# Patient Record
Sex: Male | Born: 1970 | Race: Black or African American | Hispanic: No | Marital: Single | Smoking: Never smoker
Health system: Southern US, Community
[De-identification: ages and names within clinical notes are randomized; demographics above are authoritative.]

## PROBLEM LIST (undated history)

## (undated) HISTORY — PX: STERNUM FRACTURE SURGERY: SHX790

---

## 1998-06-08 ENCOUNTER — Emergency Department (HOSPITAL_COMMUNITY): Admission: EM | Admit: 1998-06-08 | Discharge: 1998-06-08 | Payer: Self-pay | Admitting: Emergency Medicine

## 2005-06-11 ENCOUNTER — Emergency Department (HOSPITAL_COMMUNITY): Admission: EM | Admit: 2005-06-11 | Discharge: 2005-06-11 | Payer: Self-pay | Admitting: Family Medicine

## 2012-04-07 ENCOUNTER — Ambulatory Visit (INDEPENDENT_AMBULATORY_CARE_PROVIDER_SITE_OTHER): Payer: BC Managed Care – PPO | Admitting: Family Medicine

## 2012-04-07 VITALS — BP 124/81 | HR 81 | Temp 98.5°F | Resp 17 | Ht 72.5 in | Wt 180.0 lb

## 2012-04-07 DIAGNOSIS — H1032 Unspecified acute conjunctivitis, left eye: Secondary | ICD-10-CM

## 2012-04-07 DIAGNOSIS — H1030 Unspecified acute conjunctivitis, unspecified eye: Secondary | ICD-10-CM

## 2012-04-07 MED ORDER — OFLOXACIN 0.3 % OP SOLN: 2.0000 [drp] | Freq: Four times a day (QID) | OPHTHALMIC | Status: DC

## 2012-04-07 NOTE — Progress Notes (Signed)
  [facility]:  Office Visit  Chief Complaint:  Chief Complaint  Patient presents with  . Eye Injury    HPI: Richard Walters is a 42 y.o. male who complains of left eye redness. Started early this AM. Was at home with this and then went to work. 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 He did work in the yard this weekend. He has not rubbed his eyes. Denies any  vision problems, light sensitivity, eye pain.. Denies HA, fevers, + itchy. No prior eye injuries.    History reviewed. No pertinent past medical history. History reviewed. No pertinent past surgical history. History   Social History  . Marital Status: Single    Spouse Name: N/A    Number of Children: N/A  . Years of Education: N/A   Social History Main Topics  . Smoking status: Never Smoker   . Smokeless tobacco: None  . Alcohol Use: No  . Drug Use: No  . Sexually Active: Yes    Birth Control/ Protection: None   Other Topics Concern  . None   Social History Narrative  . None   Family History  Problem Relation Age of Onset  . Hypertension Father    No Known Allergies Prior to Admission medications   Not on File     ROS: The patient denies fevers, chills, night sweats, unintentional weight loss, chest pain, palpitations, wheezing, dyspnea on exertion, nausea, vomiting, abdominal pain, dysuria, hematuria, melena, numbness, weakness, or tingling.   All other systems have been reviewed and were otherwise negative with the exception of those mentioned in the HPI and as above.    PHYSICAL EXAM: Filed Vitals:   04/07/12 0928  BP: 124/81  Pulse: 81  Temp: 98.5 F (36.9 C)  Resp: 17   Filed Vitals:   04/07/12 0928  Height: 6' 0.5" (1.842 m)  Weight: 180 lb (81.647 kg)   Body mass index is 24.08 kg/(m^2).  General: Alert, no acute distress HEENT:  Normocephalic, atraumatic, oropharynx patent. EOMI, PERRLA, fundoscopic  exam nl. + injected sclera/subconjunctiva erythema on lateral aspect with yellow mucus dc. No corneal lesions on fluroscein exam Cardiovascular:  Regular rate and rhythm, no rubs murmurs or gallops.  No Carotid bruits, radial pulse intact. No pedal edema.  Respiratory: Clear to auscultation bilaterally.  No wheezes, rales, or rhonchi.  No cyanosis, no use of accessory musculature GI: No organomegaly, abdomen is soft and non-tender, positive bowel sounds.  No masses. Skin: No rashes. Neurologic: Facial musculature symmetric. Psychiatric: Patient is appropriate throughout our interaction. Lymphatic: No cervical lymphadenopathy Musculoskeletal: Gait intact.   LABS: No results found for this or any previous visit.   EKG/XRAY:   Primary read interpreted by Dr. Conley Rolls at [facility].   ASSESSMENT/PLAN: Encounter Diagnosis  Name Primary?  . Acute conjunctivitis, left eye Yes   Early Bacterial conjunctivitis vs allergic conjunctivitis Rx Ocuflox x 5 days, Cold compresses Work note x 2 days off. Rest eye.  Monitor for worsening s/sx ie swelling, vision changes, more drainage, eye pain. F/u prn    Saddie Sandeen PHUONG, DO 04/07/2012 10:39 AM

## 2012-04-08 ENCOUNTER — Telehealth: Payer: Self-pay | Admitting: Family Medicine

## 2012-04-08 NOTE — Telephone Encounter (Signed)
Patient's eye is getting better slowly.

## 2012-04-10 ENCOUNTER — Other Ambulatory Visit: Payer: Self-pay | Admitting: Family Medicine

## 2012-04-10 DIAGNOSIS — H109 Unspecified conjunctivitis: Secondary | ICD-10-CM

## 2012-04-10 MED ORDER — PREDNISOLONE ACETATE 1 % OP SUSP: 2.0000 [drp] | Freq: Four times a day (QID) | OPHTHALMIC | Status: DC

## 2012-04-11 ENCOUNTER — Ambulatory Visit (INDEPENDENT_AMBULATORY_CARE_PROVIDER_SITE_OTHER): Payer: BC Managed Care – PPO | Admitting: Family Medicine

## 2012-04-11 VITALS — BP 111/72 | HR 64 | Temp 98.1°F | Resp 18 | Ht 72.5 in | Wt 180.0 lb

## 2012-04-11 DIAGNOSIS — H1032 Unspecified acute conjunctivitis, left eye: Secondary | ICD-10-CM

## 2012-04-11 DIAGNOSIS — H109 Unspecified conjunctivitis: Secondary | ICD-10-CM

## 2012-04-11 MED ORDER — PREDNISOLONE ACETATE 1 % OP SUSP: 2.0000 [drp] | Freq: Four times a day (QID) | OPHTHALMIC | Status: DC

## 2012-04-11 MED ORDER — OFLOXACIN 0.3 % OP SOLN: 2.0000 [drp] | Freq: Four times a day (QID) | OPHTHALMIC | Status: DC

## 2012-04-11 NOTE — Progress Notes (Signed)
[facility]:  Office Visit  Chief Complaint:  Chief Complaint  Patient presents with  . Follow-up    eye    HPI: Richard Walters is a 42 y.o. male who complains of recheck left eye conjunctivitis, he now has it in right eye. Left eye is getting better but now right eye has similar redness and drainage.  When his right eye started having similar sxs he used ocuflox on both eyes. Denies any pain, vision loss or photophobia. Itches and he wasn't to scratch but denies scratching. He has some mild lid swelling, wakes up with crusted eyes. On 1/20 when he came in he had been working out in yard, he does work around [employer] but is not in direct contact with them. Wears protective eye gear. Denies any rashes/burning/tingling.   History reviewed. No pertinent past medical history. History reviewed. No pertinent past surgical history. History   Social History  . Marital Status: Single    Spouse Name: N/A    Number of Children: N/A  . Years of Education: N/A   Social History Main Topics  . Smoking status: Never Smoker   . Smokeless tobacco: None  . Alcohol Use: No  . Drug Use: No  . Sexually Active: Yes    Birth Control/ Protection: None   Other Topics Concern  . None   Social History Narrative  . None   Family History  Problem Relation Age of Onset  . Hypertension Father    No Known Allergies Prior to Admission medications   Medication Sig Start Date End Date Taking? Authorizing Provider  ofloxacin (OCUFLOX) 0.3 % ophthalmic solution Place 2 drops into the left eye 4 (four) times daily. 04/07/12  Yes Tyshika Baldridge P Kylon Philbrook, DO  prednisoLONE acetate (PRED FORTE) 1 % ophthalmic suspension Place 2 drops into the left eye 4 (four) times daily. For 3 days only 04/10/12  Yes Merissa Renwick P Tanise Russman, DO     ROS: The patient denies fevers, chills, night sweats, unintentional weight loss, chest pain, palpitations, wheezing, dyspnea on exertion, nausea, vomiting, abdominal pain, dysuria,  hematuria, melena, numbness, weakness, or tingling.   All other systems have been reviewed and were otherwise negative with the exception of those mentioned in the HPI and as above.    PHYSICAL EXAM: Filed Vitals:   04/11/12 1600  BP: 111/72  Pulse: 64  Temp: 98.1 F (36.7 C)  Resp: 18   Filed Vitals:   04/11/12 1600  Height: 6' 0.5" (1.842 m)  Weight: 180 lb (81.647 kg)   Body mass index is 24.08 kg/(m^2).  General: Alert, no acute distress HEENT:  Normocephalic, atraumatic, oropharynx patent. EOMI, PERRLA, fundoscpic exam nl. Sclera slightly erythematous bilaterally, No photophobia. + yellow and white mucus  Cardiovascular:  Regular rate and rhythm, no rubs murmurs or gallops.  No Carotid bruits, radial pulse intact. No pedal edema.  Respiratory: Clear to auscultation bilaterally.  No wheezes, rales, or rhonchi.  No cyanosis, no use of accessory musculature GI: No organomegaly, abdomen is soft and non-tender, positive bowel sounds.  No masses. Skin: No rashes. Neurologic: Facial musculature symmetric. Psychiatric: Patient is appropriate throughout our interaction. Lymphatic: No cervical lymphadenopathy Musculoskeletal: Gait intact.   LABS: No results found for this or any previous visit.   EKG/XRAY:   Primary read interpreted by Dr. Conley Rolls at [facility].   ASSESSMENT/PLAN: Encounter Diagnoses  Name Primary?  . Conjunctivitis of both eyes Yes  . Conjunctivitis   . Acute conjunctivitis, left eye  Patient doing about the same but now conjunctivitis in both eyes.  ? allergic vs less likely  bacterial or combination of both Fluoroscein exam had no uptake.  F/u prn, Reorder meds.  Ocuflox and prednisolone opthalmic. Cold compresses    Kacper Cartlidge PHUONG, DO 04/11/2012 4:32 PM

## 2014-07-13 ENCOUNTER — Telehealth: Payer: Self-pay | Admitting: Family Medicine

## 2014-07-13 NOTE — Telephone Encounter (Signed)
lmom to reschedule his appt Dr Conley RollsLe will be out of the office on 07/30/14

## 2014-07-16 ENCOUNTER — Ambulatory Visit (INDEPENDENT_AMBULATORY_CARE_PROVIDER_SITE_OTHER): Payer: BLUE CROSS/BLUE SHIELD | Admitting: Family Medicine

## 2014-07-16 ENCOUNTER — Encounter: Payer: Self-pay | Admitting: Family Medicine

## 2014-07-16 VITALS — BP 100/66 | HR 67 | Temp 97.9°F | Resp 16 | Ht 73.0 in | Wt 177.2 lb

## 2014-07-16 DIAGNOSIS — G44209 Tension-type headache, unspecified, not intractable: Secondary | ICD-10-CM

## 2014-07-16 DIAGNOSIS — Z23 Encounter for immunization: Secondary | ICD-10-CM

## 2014-07-16 DIAGNOSIS — Z0000 Encounter for general adult medical examination without abnormal findings: Secondary | ICD-10-CM

## 2014-07-16 DIAGNOSIS — Z113 Encounter for screening for infections with a predominantly sexual mode of transmission: Secondary | ICD-10-CM

## 2014-07-16 LAB — COMPREHENSIVE METABOLIC PANEL
ALT: 19 U/L (ref 0–53)
AST: 20 U/L (ref 0–37)
Albumin: 4.1 g/dL (ref 3.5–5.2)
Alkaline Phosphatase: 47 U/L (ref 39–117)
BUN: 12 mg/dL (ref 6–23)
CALCIUM: 9 mg/dL (ref 8.4–10.5)
CHLORIDE: 103 meq/L (ref 96–112)
CO2: 26 mEq/L (ref 19–32)
CREATININE: 1.01 mg/dL (ref 0.50–1.35)
GLUCOSE: 82 mg/dL (ref 70–99)
POTASSIUM: 4.2 meq/L (ref 3.5–5.3)
SODIUM: 139 meq/L (ref 135–145)
TOTAL PROTEIN: 6.4 g/dL (ref 6.0–8.3)
Total Bilirubin: 0.5 mg/dL (ref 0.2–1.2)

## 2014-07-16 LAB — POCT URINALYSIS DIPSTICK
BILIRUBIN UA: NEGATIVE
GLUCOSE UA: NEGATIVE
KETONES UA: NEGATIVE
Leukocytes, UA: NEGATIVE
Nitrite, UA: NEGATIVE
PH UA: 6.5
Protein, UA: NEGATIVE
RBC UA: NEGATIVE
SPEC GRAV UA: 1.01
Urobilinogen, UA: 1

## 2014-07-16 LAB — LIPID PANEL
Cholesterol: 152 mg/dL (ref 0–200)
HDL: 47 mg/dL (ref >=40)
LDL CALC: 86 mg/dL (ref 0–99)
TRIGLYCERIDES: 93 mg/dL (ref <150)
Total CHOL/HDL Ratio: 3.2 Ratio
VLDL: 19 mg/dL (ref 0–40)

## 2014-07-16 LAB — TSH: TSH: 2.201 u[IU]/mL (ref 0.350–4.500)

## 2014-07-16 NOTE — Progress Notes (Signed)
Subjective:  This chart was scribed for Norberto Sorenson, MD by [facility], medical scribe at [facility].The patient was seen in exam room 24 and the patient's care was started at 2:46 PM.   Patient ID: Richard Walters, male    DOB: 1970/04/25, 44 y.o.   MRN: [id number] Chief Complaint  Patient presents with   Annual Exam   HPI HPI Comments: Richard Walters is a 43 y.o. male who presents to [facility] for an annual physical exam. Pt had blood drawn this morning.  Family history of heart disease and high cholesterol, diabetes on the paternal side. Pt does not smoke, he has roughly four drinks a week.Unsure of last tetanus shot. He takes vitamin supplements. 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 Pt has intermittent back pain, headache and fatigue due to work. No history of depression.  Occasionally cannot sleep, he does not wake up refreshed after sleeping 6 hours but does after 8 hours. He has been told he snores.  Pt complains of rhinorrhea, and increased bowel movement after eating or drinking diary products.   No past medical history on file. Current Outpatient Prescriptions on File Prior to Visit  Medication Sig Dispense Refill   ofloxacin (OCUFLOX) 0.3 % ophthalmic solution Place 2 drops into both eyes 4 (four) times daily. (Patient not taking: Reported on 07/16/2014) 5 mL 0   prednisoLONE acetate (PRED FORTE) 1 % ophthalmic suspension Place 2 drops into both eyes 4 (four) times daily. For 3 days only (Patient not taking: Reported on 07/16/2014) 5 mL 0   No current facility-administered medications on file prior to visit.   No Known Allergies History   Social History   Marital Status: Single    Spouse Name: N/A   Number of Children: N/A   Years of Education: N/A   Occupational History   Location manager    Social History Main Topics   Smoking status: Never Smoker     Smokeless tobacco: Not on file   Alcohol Use: 2.4 oz/week    4 Standard drinks or equivalent per week     Comment: BEER and WINE   Drug Use: No   Sexual Activity: Yes    Pharmacist, hospital Protection: None   Other Topics Concern   Not on file   Social History Narrative   Exercise running, weights, and cardio   Review of Systems  Constitutional: Positive for fatigue.  HENT: Positive for rhinorrhea.   Gastrointestinal: Positive for diarrhea.  Musculoskeletal: Positive for back pain.  Neurological: Positive for headaches.  Psychiatric/Behavioral: Positive for sleep disturbance.      Objective:  BP 100/66 mmHg   Pulse 67   Temp(Src) 97.9 F (36.6 C) (Oral)   Resp 16   Ht  (1.854 m)   Wt 177 lb 3.2 oz (80.377 kg)   BMI 23.38 kg/m2   SpO2 97% Physical Exam  Constitutional: He is oriented to person, place, and time. He appears well-developed and well-nourished. No distress.  HENT:  Head: Normocephalic and atraumatic.  Minimal post nasal drip.  Eyes: Pupils are equal, round, and reactive to light.  Neck: Normal range of motion.  Cardiovascular: Normal rate, regular rhythm, S1 normal, S2 normal and normal heart sounds.   No murmur heard. Pulmonary/Chest: Effort normal. No respiratory distress.  Musculoskeletal: Normal range of motion.  Neurological: He is alert and oriented  to person, place, and time.  Skin: Skin is warm and dry.  Psychiatric: He has a normal mood and affect. His behavior is normal.  Nursing note and vitals reviewed.     Assessment & Plan:   1. Health maintenance examination   2. Screening for STD (sexually transmitted disease)   3. Tension headache   4. Need for diphtheria-tetanus-pertussis (Tdap) vaccine, adult/adolescent     Orders Placed This Encounter  Procedures   GC/Chlamydia Probe Amp   Trichomonas vaginalis, RNA   Tdap vaccine greater than or equal to 7yo IM   CBC   Lipid panel    Order Specific Question:  Has the patient  fasted?    Answer:  Yes   TSH   RPR   Comprehensive metabolic panel    Order Specific Question:  Has the patient fasted?    Answer:  Yes   HIV antibody   Hepatitis C antibody   POCT urinalysis dipstick   EKG 12-Lead     I personally performed the services described in this documentation, which was scribed in my presence. The recorded information has been reviewed and considered, and addended by me as needed.  Norberto SorensonEva Shaw, MD MPH   Results for orders placed or performed in visit on 07/16/14  CBC  Result Value Ref Range   WBC 3.8 (L) 4.0 - 10.5 K/uL   RBC 4.96 4.22 - 5.81 MIL/uL   Hemoglobin 14.9 13.0 - 17.0 g/dL   HCT 16.145.1 09.639.0 - 04.552.0 %   MCV 90.9 78.0 - 100.0 fL   MCH 30.0 26.0 - 34.0 pg   MCHC 33.0 30.0 - 36.0 g/dL   RDW 40.914.5 81.111.5 - 91.415.5 %   Platelets 202 150 - 400 K/uL   MPV 11.0 8.6 - 12.4 fL  Lipid panel  Result Value Ref Range   Cholesterol 152 0 - 200 mg/dL   Triglycerides 93 <782<150 mg/dL   HDL 47 >=95>=40 mg/dL   Total CHOL/HDL Ratio 3.2 Ratio   VLDL 19 0 - 40 mg/dL   LDL Cholesterol 86 0 - 99 mg/dL  TSH  Result Value Ref Range   TSH 2.201 0.350 - 4.500 uIU/mL  RPR  Result Value Ref Range   RPR Ser Ql NON REAC NON REAC  Comprehensive metabolic panel  Result Value Ref Range   Sodium 139 135 - 145 mEq/L   Potassium 4.2 3.5 - 5.3 mEq/L   Chloride 103 96 - 112 mEq/L   CO2 26 19 - 32 mEq/L   Glucose, Bld 82 70 - 99 mg/dL   BUN 12 6 - 23 mg/dL   Creat 6.211.01 3.080.50 - 6.571.35 mg/dL   Total Bilirubin 0.5 0.2 - 1.2 mg/dL   Alkaline Phosphatase 47 39 - 117 U/L   AST 20 0 - 37 U/L   ALT 19 0 - 53 U/L   Total Protein 6.4 6.0 - 8.3 g/dL   Albumin 4.1 3.5 - 5.2 g/dL   Calcium 9.0 8.4 - 84.610.5 mg/dL  HIV antibody  Result Value Ref Range   HIV 1&2 Ab, 4th Generation NONREACTIVE NONREACTIVE  Hepatitis C antibody  Result Value Ref Range   HCV Ab NEGATIVE NEGATIVE  POCT urinalysis dipstick  Result Value Ref Range   Color, UA Light Yellow    Clarity, UA Clear     Glucose, UA Negative    Bilirubin, UA Negative    Ketones, UA Negative    Spec Grav, UA 1.010    Blood, UA Negative  pH, UA 6.5    Protein, UA Negative    Urobilinogen, UA 1.0    Nitrite, UA Negative    Leukocytes, UA Negative

## 2014-07-17 LAB — CBC
HEMATOCRIT: 45.1 % (ref 39.0–52.0)
Hemoglobin: 14.9 g/dL (ref 13.0–17.0)
MCH: 30 pg (ref 26.0–34.0)
MCHC: 33 g/dL (ref 30.0–36.0)
MCV: 90.9 fL (ref 78.0–100.0)
MPV: 11 fL (ref 8.6–12.4)
PLATELETS: 202 10*3/uL (ref 150–400)
RBC: 4.96 MIL/uL (ref 4.22–5.81)
RDW: 14.5 % (ref 11.5–15.5)
WBC: 3.8 10*3/uL — ABNORMAL LOW (ref 4.0–10.5)

## 2014-07-17 LAB — HEPATITIS C ANTIBODY: HCV AB: NEGATIVE

## 2014-07-17 LAB — HIV ANTIBODY (ROUTINE TESTING W REFLEX): HIV: NONREACTIVE

## 2014-07-17 LAB — RPR

## 2014-07-20 LAB — GC/CHLAMYDIA PROBE AMP
CT Probe RNA: NEGATIVE
GC Probe RNA: NEGATIVE

## 2014-07-20 LAB — TRICHOMONAS VAGINALIS, PROBE AMP: TRICHOMONAS VAGINALIS PROBE APTIMA: NEGATIVE

## 2014-07-21 ENCOUNTER — Encounter: Payer: Self-pay | Admitting: Physician Assistant

## 2014-07-23 ENCOUNTER — Telehealth: Payer: Self-pay

## 2014-07-23 NOTE — Telephone Encounter (Signed)
Dr. Clelia CroftShaw, I called patient to find out what specifically the patient was requesting FMLA for. He states that he is wanting a reduced work schedule due to tension headaches that were discussed during his OV. I filled the paperwork out based on the OV notes, but was not sure if this was discussed in enough detail to warrant FMLA. I will place paperwork in your box for your review. Please let me know if I can do anything else. Thank you

## 2014-07-23 NOTE — Telephone Encounter (Signed)
Pt dropped off FMLA ppw for completion by Dr. Clelia CroftShaw in 5-7 business days. Will place in Dr. Alver FisherShaw's box for review/signature. Please return to disability box, located at [address] checkout, upon completion. Blank copies have been scanned into epic under release [id number]. Once received Jasmine or myself will Scan Completed paperwork into release, fax to [phone], and notify patient.

## 2014-07-26 NOTE — Telephone Encounter (Signed)
Jasmine, release was closed on 5/8, but no paperwork scanned into release, did you receive this? Placing back on hold until paperwork has been scanned into release.

## 2014-07-30 ENCOUNTER — Encounter: Payer: Self-pay | Admitting: Family Medicine

## 2014-07-30 DIAGNOSIS — Z0271 Encounter for disability determination: Secondary | ICD-10-CM

## 2014-09-26 NOTE — Telephone Encounter (Signed)
Found pt's paperwork and completed it - returned to disability slot - no charge please if pt has not paid already due to length of time it took me to complete. sorry

## 2015-01-23 ENCOUNTER — Encounter: Payer: Self-pay | Admitting: Family Medicine

## 2015-05-11 ENCOUNTER — Ambulatory Visit (INDEPENDENT_AMBULATORY_CARE_PROVIDER_SITE_OTHER): Payer: PRIVATE HEALTH INSURANCE | Admitting: Family Medicine

## 2015-05-11 VITALS — BP 130/80 | HR 78 | Temp 98.3°F | Resp 20 | Ht 73.5 in | Wt 176.0 lb

## 2015-05-11 DIAGNOSIS — Z202 Contact with and (suspected) exposure to infections with a predominantly sexual mode of transmission: Secondary | ICD-10-CM | POA: Diagnosis not present

## 2015-05-11 MED ORDER — AZITHROMYCIN 500 MG PO TABS: 1000.0000 mg | ORAL_TABLET | Freq: Once | ORAL | Status: DC

## 2015-05-11 NOTE — Progress Notes (Signed)
This a 45 year old gentleman who works for [employer]. He presents after his girlfriend found out that she has chlamydia and she's broken off her relationship with him today.  Patient had no symptoms of discharge or burning. He's had no skin lesions on his external genitalia.  He's been with his girlfriend for some time and was hoping to buy a house with her until this recent development.  Objective:BP 130/80 mmHg  Pulse 78  Temp(Src) 98.3 F (36.8 C) (Oral)  Resp 20  Ht 6' 1.5" (1.867 m)  Wt 176 lb (79.833 kg)  BMI 22.90 kg/m2  SpO2 99% HEENT: Unremarkable Genitalia: Normal male with no skin changes, circumcised  This chart was  reviewed by me personally.    ICD-9-CM ICD-10-CM   1. STD exposure V01.6 Z20.2 GC/Chlamydia Probe Amp     RPR     HIV antibody     azithromycin (ZITHROMAX) 500 MG tablet     Signed, Elvina Sidle, MD

## 2015-05-12 LAB — GC/CHLAMYDIA PROBE AMP
CT Probe RNA: DETECTED — AB
GC Probe RNA: NOT DETECTED

## 2015-05-12 LAB — RPR

## 2015-05-12 LAB — HIV ANTIBODY (ROUTINE TESTING W REFLEX): HIV 1&2 Ab, 4th Generation: NONREACTIVE

## 2015-11-01 ENCOUNTER — Ambulatory Visit (INDEPENDENT_AMBULATORY_CARE_PROVIDER_SITE_OTHER): Payer: BLUE CROSS/BLUE SHIELD | Admitting: Physician Assistant

## 2015-11-01 VITALS — BP 122/84 | HR 87 | Temp 97.0°F | Resp 16 | Ht 73.0 in | Wt 176.9 lb

## 2015-11-01 DIAGNOSIS — R300 Dysuria: Secondary | ICD-10-CM | POA: Diagnosis not present

## 2015-11-01 NOTE — Progress Notes (Signed)
   Nicola PoliceGlen L Sultan  MRN: [id number] DOB: Jun 29, 1970  Subjective:  Pt presents to clinic with urethral tingling that started yesterday.  He had sex with his ex without barrier protection 5 days ago.  He has had no urethral discharge.  The sensation does not worsen with urination but it is there all the time.  He thought it might be he needed to increase fluids but he did that yesterday but the symptoms did not improve.  He is having no scrotum or testicular pain.  Review of Systems  Constitutional: Negative for chills and fever.  Gastrointestinal: Negative for abdominal pain.  Genitourinary: Positive for dysuria and penile pain (tingling). Negative for decreased urine volume, discharge, flank pain, frequency, penile swelling, scrotal swelling, testicular pain and urgency.    There are no active problems to display for this patient.   No current outpatient prescriptions on file prior to visit.   No current facility-administered medications on file prior to visit.     Not on File  Pt patients past, family and social history were reviewed and updated.  Objective:  BP 122/84 (BP Location: Left Arm, Patient Position: Sitting, Cuff Size: Normal)   Pulse 87   Temp 97 F (36.1 C) (Oral)   Resp 16   Ht 6\' 1"  (1.854 m)   Wt 176 lb 14.4 oz (80.2 kg)   SpO2 98%   BMI 23.34 kg/m   Physical Exam  Constitutional: He is oriented to person, place, and time and well-developed, well-nourished, and in no distress.  HENT:  Head: Normocephalic and atraumatic.  Right Ear: External ear normal.  Left Ear: External ear normal.  Eyes: Conjunctivae are normal.  Neck: Normal range of motion.  Pulmonary/Chest: Effort normal.  Genitourinary: Testes/scrotum normal. He exhibits no testicular tenderness and no scrotal tenderness. Clear and discharge found.  Neurological: He is alert and oriented to person, place, and time. Gait normal.  Skin: Skin is warm and dry.  Psychiatric: Mood, memory, affect  and judgment normal.    Assessment and Plan :  Dysuria - Plan: GC/Chlamydia Probe Amp, Trichomonas vaginalis, RNA  Check lab - treat if indicated -- d/w pt other STD testing that he might want to consider having and he will consider that in the future but today he would like to just do the above.  Benny LennertSarah Arlita Buffkin PA-C  [facility] 11/01/2015 7:26 PM

## 2015-11-01 NOTE — Patient Instructions (Signed)
     IF you received an x-ray today, you will receive an invoice from [facility]. Please contact [facility] at [phone] with questions or concerns regarding your invoice.   IF you received labwork today, you will receive an invoice from [facility]. Please contact [facility] at [phone] with questions or concerns regarding your invoice.   Our billing staff will not be able to assist you with questions regarding bills from these companies.  You will be contacted with the lab results as soon as they are available. The fastest way to get your results is to activate your My Chart account. Instructions are located on the last page of this paperwork. If you have not heard from us regarding the results in 2 weeks, please contact this office.      

## 2015-11-02 LAB — GC/CHLAMYDIA PROBE AMP
CT Probe RNA: NOT DETECTED
GC Probe RNA: NOT DETECTED

## 2015-11-02 LAB — TRICHOMONAS VAGINALIS, PROBE AMP: TRICHOMONAS VAGINALIS PROBE APTIMA: NOT DETECTED

## 2016-11-26 ENCOUNTER — Encounter: Payer: Self-pay | Admitting: Physician Assistant

## 2016-11-26 ENCOUNTER — Ambulatory Visit (INDEPENDENT_AMBULATORY_CARE_PROVIDER_SITE_OTHER): Payer: BLUE CROSS/BLUE SHIELD | Admitting: Physician Assistant

## 2016-11-26 VITALS — BP 124/86 | HR 66 | Temp 98.2°F | Resp 16 | Ht 73.0 in | Wt 175.8 lb

## 2016-11-26 DIAGNOSIS — Z113 Encounter for screening for infections with a predominantly sexual mode of transmission: Secondary | ICD-10-CM

## 2016-11-26 DIAGNOSIS — Z0000 Encounter for general adult medical examination without abnormal findings: Secondary | ICD-10-CM

## 2016-11-26 DIAGNOSIS — Z125 Encounter for screening for malignant neoplasm of prostate: Secondary | ICD-10-CM | POA: Diagnosis not present

## 2016-11-26 DIAGNOSIS — Z114 Encounter for screening for human immunodeficiency virus [HIV]: Secondary | ICD-10-CM | POA: Diagnosis not present

## 2016-11-26 DIAGNOSIS — Z130 Encounter for screening for diseases of the blood and blood-forming organs and certain disorders involving the immune mechanism: Secondary | ICD-10-CM

## 2016-11-26 DIAGNOSIS — Z1329 Encounter for screening for other suspected endocrine disorder: Secondary | ICD-10-CM | POA: Diagnosis not present

## 2016-11-26 DIAGNOSIS — Z13220 Encounter for screening for lipoid disorders: Secondary | ICD-10-CM | POA: Diagnosis not present

## 2016-11-26 DIAGNOSIS — Z13228 Encounter for screening for other metabolic disorders: Secondary | ICD-10-CM

## 2016-11-26 NOTE — Patient Instructions (Addendum)
IF you received an x-ray today, you will receive an invoice from [facility]. Please contact [facility] at [phone] with questions or concerns regarding your invoice.   IF you received labwork today, you will receive an invoice from [facility]. Please contact [facility] at [phone] with questions or concerns regarding your invoice.   Our billing staff will not be able to assist you with questions regarding bills from these companies.  You will be contacted with the lab results as soon as they are available. The fastest way to get your results is to activate your My Chart account. Instructions are located on the last page of this paperwork. If you have not heard from [place] regarding the results in 2 weeks, please contact this office.    Keeping you healthy  Get these tests  Blood pressure- Have your blood pressure checked once a year by your healthcare provider.  Normal blood pressure is 120/80.  Weight- Have your body mass index (BMI) calculated to screen for obesity.  BMI is a measure of body fat based on height and weight. You can also calculate your own BMI at [employer]velBags.it.  Cholesterol- Have your cholesterol checked regularly starting at age 21, sooner may be necessary if you have diabetes, high blood pressure, if a family member developed heart diseases at an early age or if you smoke.   Chlamydia, HIV, and other sexual transmitted disease- Get screened each year until the age of 65 then within three months of each new sexual partner.  Diabetes- Have your blood sugar checked regularly if you have high blood pressure, high cholesterol, a family history of diabetes or if you are overweight.  Get these vaccines  Flu shot- Every fall.  Tetanus shot- Every 10 years.  Menactra- Single dose; prevents meningitis.  Take these steps  Don't smoke- If you do smoke, ask your healthcare provider about quitting. For tips on how to quit, go to  www.smokefree.gov or call 1-800-QUIT-NOW.  Be physically active- Exercise 5 days a week for at least 30 minutes.  If you are not already physically active start slow and gradually work up to 30 minutes of moderate physical activity.  Examples of moderate activity include walking briskly, mowing the yard, dancing, swimming bicycling, etc.  Eat a healthy diet- Eat a variety of healthy foods such as fruits, vegetables, low fat milk, low fat cheese, yogurt, lean meats, poultry, fish, beans, tofu, etc.  For more information on healthy eating, go to www.thenutritionsource.org  Drink alcohol in moderation- Limit alcohol intake two drinks or less a day.  Never drink and drive.  Dentist- Brush and floss teeth twice daily; visit your dentis twice a year.  Depression-Your emotional health is as important as your physical health.  If you're feeling down, losing interest in things you normally enjoy please talk with your healthcare provider.  Gun Safety- If you keep a gun in your home, keep it unloaded and with the safety lock on.  Bullets should be stored separately.  Helmet use- Always wear a helmet when riding a motorcycle, bicycle, rollerblading or skateboarding.  Safe sex- If you may be exposed to a sexually transmitted infection, use a condom  Seat belts- Seat bels can save your life; always wear one.  Smoke/Carbon Monoxide detectors- These detectors need to be installed on the appropriate level of your home.  Replace batteries at least once a year.  Skin Cancer- When out in the sun, cover up and use sunscreen SPF 15 or higher.  Violence- If anyone is threatening or hurting you, please tell your healthcare provider.   Safe Sex Practicing safe sex means taking steps before and during sex to reduce your risk of:  Getting an STD (sexually transmitted disease).  Giving your partner an STD.  Unwanted pregnancy.  How can I practice safe sex?  To practice safe sex:  Limit your sexual  partners to only one partner who is having sex with only you.  Avoid using alcohol and recreational drugs before having sex. These substances can affect your judgment.  Before having sex with a new partner: ? Talk to your partner about past partners, past STDs, and drug use. ? You and your partner should be screened for STDs and discuss the results with each other.  Check your body regularly for sores, blisters, rashes, or unusual discharge. If you notice any of these problems, visit your health care provider.  If you have symptoms of an infection or you are being treated for an STD, avoid sexual contact.  While having sex, use a condom. Make sure to: ? Use a condom every time you have vaginal, oral, or anal sex. Both females and males should wear condoms during oral sex. ? Keep condoms in place from the beginning to the end of sexual activity. ? Use a latex condom, if possible. Latex condoms offer the best protection. ? Use only water-based lubricants or oils to lubricate a condom. Using petroleum-based lubricants or oils will weaken the condom and increase the chance that it will break.  See your health care provider for regular screenings, exams, and tests for STDs.  Talk with your health care provider about the form of birth control (contraception) that is best for you.  Get vaccinated against hepatitis B and human papillomavirus (HPV).  If you are at risk of being infected with HIV (human immunodeficiency virus), talk with your health care provider about taking a prescription medicine to prevent HIV infection. You are considered at risk for HIV if: ? You are a man who has sex with other men. ? You are a heterosexual man or woman who is sexually active with more than one partner. ? You take drugs by injection. ? You are sexually active with a partner who has HIV.  This information is not intended to replace advice given to you by your health care provider. Make sure you discuss any  questions you have with your health care provider. Document Released: 04/12/2004 Document Revised: 07/20/2015 Document Reviewed: 01/23/2015 Elsevier Interactive Patient Education  [employer]2018 Elsevier Inc.

## 2016-11-26 NOTE — Progress Notes (Signed)
PRIMARY CARE AT [facility] [address] [place] [address] [phone]  Date:  11/26/2016   Name:  AHMIR BRACKEN   DOB:  06-04-1970   MRN:  [id number]  PCP:  Shawnee Knapp, MD    History of Present Illness:  ROBBI SCURLOCK is a 46 y.o. male patient who presents to PCP with  Chief Complaint  Patient presents with  . Annual Exam     DIET: he cooks every now and then.  Cooks fast food occasionally.  Vegetables daily with salad, collard greens.  Water intake: 64-84 oz.  Sodas: 2 per day non caffeinated.  No caffeine    BM: normal.  No blood or black stool.  No constipation.  BM are regular after meals.   URINATION: normal.  Dysuria, hematuria, or frequency  SLEEP: 6 hours.  No difficulty getting or staying asleep  SOCIAL ACTIVITY: motor cycle, detail cars, outdoor activities.  Wears helmet in [place] --exercise: play tennis, walk  EtOH: 3-4 beers per week Illicit drug use: none Tobacco or vaping: none  SEXUAL activity: multiple partners.  Unprotected.   Last opthalmology: 2018, slight change.   There are no active problems to display for this patient.   History reviewed. No pertinent past medical history.  History reviewed. No pertinent surgical history.  Social History  Substance Use Topics  . Smoking status: Never Smoker  . Smokeless tobacco: Never Used  . Alcohol use 2.4 oz/week    4 Standard drinks or equivalent per week     Comment: BEER and WINE    Family History  Problem Relation Age of Onset  . Hypertension Father   . Anemia Sister   . Diabetes Paternal Grandmother   . Hyperlipidemia Paternal Grandmother     No Known Allergies  Medication list has been reviewed and updated.  No current outpatient prescriptions on file prior to visit.   No current facility-administered medications on file prior to visit.     Review of Systems  Constitutional: Negative for chills and fever.  HENT: Negative for ear discharge, ear pain and sore throat.   Eyes:  Negative for blurred vision and double vision.  Respiratory: Negative for cough, shortness of breath and wheezing.   Cardiovascular: Negative for chest pain, palpitations and leg swelling.  Gastrointestinal: Negative for diarrhea, nausea and vomiting.  Genitourinary: Negative for dysuria, frequency and hematuria.  Skin: Negative for itching and rash.  Neurological: Negative for dizziness and headaches.   ROS otherwise unremarkable unless listed above.  Physical Examination: BP 124/86   Pulse 66   Temp 98.2 F (36.8 C) (Oral)   Resp 16   Ht _0  (1.854 m)   Wt 175 lb 12.8 oz (79.7 kg)   SpO2 97%   BMI 23.19 kg/m  Ideal Body Weight: Weight in (lb) to have BMI = 25: 189.1  Physical Exam  Constitutional: He is oriented to person, place, and time. He appears well-developed and well-nourished. No distress.  HENT:  Head: Normocephalic and atraumatic.  Right Ear: Tympanic membrane, external ear and ear canal normal.  Left Ear: Tympanic membrane, external ear and ear canal normal.  Eyes: Pupils are equal, round, and reactive to light. Conjunctivae and EOM are normal.  Cardiovascular: Normal rate and regular rhythm.  Exam reveals no friction rub.   No murmur heard. Pulmonary/Chest: Effort normal. No respiratory distress. He has no wheezes.  Abdominal: Soft. Bowel sounds are normal. He exhibits no distension and no mass. There is no tenderness.  Musculoskeletal: Normal range of motion. He exhibits no edema or tenderness.  Neurological: He is alert and oriented to person, place, and time. He displays normal reflexes.  Skin: Skin is warm and dry. He is not diaphoretic.  Psychiatric: He has a normal mood and affect. His behavior is normal.     Assessment and Plan: BRAYDON KULLMAN is a 46 y.o. male who is here today for annual physical exam This is normal Discussed safe sex practices Declines prostate exam Annual physical exam - Plan: CBC, CMP14+EGFR, RPR, PSA, Lipid panel, TSH, HIV  antibody, GC/Chlamydia Probe Amp  Screening for deficiency anemia - Plan: CBC  Screening for metabolic disorder - Plan: CMP14+EGFR  Screening for STD (sexually transmitted disease) - Plan: RPR, HIV antibody, GC/Chlamydia Probe Amp  Screening for lipid disorders - Plan: Lipid panel  Screening for prostate cancer - Plan: PSA  Screening for thyroid disorder - Plan: TSH  Screening for HIV (human immunodeficiency virus) - Plan: HIV antibody  Ivar Drape, PA-C [facility] 9/12/20184:47 PM

## 2016-11-27 LAB — CMP14+EGFR
ALT: 23 IU/L (ref 0–44)
AST: 24 IU/L (ref 0–40)
Albumin/Globulin Ratio: 1.5 (ref 1.2–2.2)
Albumin: 4.8 g/dL (ref 3.5–5.5)
Alkaline Phosphatase: 55 IU/L (ref 39–117)
BUN/Creatinine Ratio: 10 (ref 9–20)
BUN: 12 mg/dL (ref 6–24)
Bilirubin Total: 0.5 mg/dL (ref 0.0–1.2)
CALCIUM: 9.7 mg/dL (ref 8.7–10.2)
CO2: 23 mmol/L (ref 20–29)
CREATININE: 1.18 mg/dL (ref 0.76–1.27)
Chloride: 99 mmol/L (ref 96–106)
GFR calc Af Amer: 87 mL/min/{1.73_m2} (ref >59)
GFR, EST NON AFRICAN AMERICAN: 75 mL/min/{1.73_m2} (ref >59)
Globulin, Total: 3.2 g/dL (ref 1.5–4.5)
Glucose: 85 mg/dL (ref 65–99)
Potassium: 4 mmol/L (ref 3.5–5.2)
Sodium: 141 mmol/L (ref 134–144)
Total Protein: 8 g/dL (ref 6.0–8.5)

## 2016-11-27 LAB — CBC
HEMATOCRIT: 49 % (ref 37.5–51.0)
Hemoglobin: 16.5 g/dL (ref 13.0–17.7)
MCH: 30.7 pg (ref 26.6–33.0)
MCHC: 33.7 g/dL (ref 31.5–35.7)
MCV: 91 fL (ref 79–97)
PLATELETS: 239 10*3/uL (ref 150–379)
RBC: 5.37 x10E6/uL (ref 4.14–5.80)
RDW: 14.9 % (ref 12.3–15.4)
WBC: 5.2 10*3/uL (ref 3.4–10.8)

## 2016-11-27 LAB — HIV ANTIBODY (ROUTINE TESTING W REFLEX): HIV SCREEN 4TH GENERATION: NONREACTIVE

## 2016-11-27 LAB — LIPID PANEL
CHOLESTEROL TOTAL: 202 mg/dL — AB (ref 100–199)
Chol/HDL Ratio: 3.3 ratio (ref 0.0–5.0)
HDL: 61 mg/dL (ref >39)
LDL Calculated: 130 mg/dL — ABNORMAL HIGH (ref 0–99)
TRIGLYCERIDES: 56 mg/dL (ref 0–149)
VLDL CHOLESTEROL CAL: 11 mg/dL (ref 5–40)

## 2016-11-27 LAB — TSH: TSH: 1.96 u[IU]/mL (ref 0.450–4.500)

## 2016-11-27 LAB — RPR: RPR Ser Ql: NONREACTIVE

## 2016-11-27 LAB — PSA: PROSTATE SPECIFIC AG, SERUM: 0.7 ng/mL (ref 0.0–4.0)

## 2016-11-28 LAB — GC/CHLAMYDIA PROBE AMP
Chlamydia trachomatis, NAA: NEGATIVE
NEISSERIA GONORRHOEAE BY PCR: NEGATIVE

## 2017-06-26 ENCOUNTER — Encounter: Payer: Self-pay | Admitting: Physician Assistant

## 2017-09-03 ENCOUNTER — Ambulatory Visit (INDEPENDENT_AMBULATORY_CARE_PROVIDER_SITE_OTHER): Payer: BLUE CROSS/BLUE SHIELD | Admitting: Urgent Care

## 2017-09-03 ENCOUNTER — Encounter: Payer: Self-pay | Admitting: Urgent Care

## 2017-09-03 ENCOUNTER — Other Ambulatory Visit: Payer: Self-pay

## 2017-09-03 VITALS — BP 129/85 | HR 55 | Temp 97.5°F | Resp 16 | Ht 73.0 in | Wt 177.0 lb

## 2017-09-03 DIAGNOSIS — R4582 Worries: Secondary | ICD-10-CM

## 2017-09-03 DIAGNOSIS — Z113 Encounter for screening for infections with a predominantly sexual mode of transmission: Secondary | ICD-10-CM

## 2017-09-03 NOTE — Patient Instructions (Signed)
Safe Sex Practicing safe sex means taking steps before and during sex to reduce your risk of:  Getting an STD (sexually transmitted disease).  Giving your partner an STD.  Unwanted pregnancy.  How can I practice safe sex?  To practice safe sex:  Limit your sexual partners to only one partner who is having sex with only you.  Avoid using alcohol and recreational drugs before having sex. These substances can affect your judgment.  Before having sex with a new partner: ? Talk to your partner about past partners, past STDs, and drug use. ? You and your partner should be screened for STDs and discuss the results with each other.  Check your body regularly for sores, blisters, rashes, or unusual discharge. If you notice any of these problems, visit your health care provider.  If you have symptoms of an infection or you are being treated for an STD, avoid sexual contact.  While having sex, use a condom. Make sure to: ? Use a condom every time you have vaginal, oral, or anal sex. Both females and males should wear condoms during oral sex. ? Keep condoms in place from the beginning to the end of sexual activity. ? Use a latex condom, if possible. Latex condoms offer the best protection. ? Use only water-based lubricants or oils to lubricate a condom. Using petroleum-based lubricants or oils will weaken the condom and increase the chance that it will break.  See your health care provider for regular screenings, exams, and tests for STDs.  Talk with your health care provider about the form of birth control (contraception) that is best for you.  Get vaccinated against hepatitis B and human papillomavirus (HPV).  If you are at risk of being infected with HIV (human immunodeficiency virus), talk with your health care provider about taking a prescription medicine to prevent HIV infection. You are considered at risk for HIV if: ? You are a man who has sex with other men. ? You are a  heterosexual man or woman who is sexually active with more than one partner. ? You take drugs by injection. ? You are sexually active with a partner who has HIV.  This information is not intended to replace advice given to you by your health care provider. Make sure you discuss any questions you have with your health care provider. Document Released: 04/12/2004 Document Revised: 07/20/2015 Document Reviewed: 01/23/2015 Elsevier Interactive Patient Education  2018 Elsevier Inc.     IF you received an x-ray today, you will receive an invoice from [facility]. Please contact [facility] at [phone] with questions or concerns regarding your invoice.   IF you received labwork today, you will receive an invoice from [facility]. Please contact [facility] at [phone] with questions or concerns regarding your invoice.   Our billing staff will not be able to assist you with questions regarding bills from these companies.  You will be contacted with the lab results as soon as they are available. The fastest way to get your results is to activate your My Chart account. Instructions are located on the last page of this paperwork. If you have not heard from us regarding the results in 2 weeks, please contact this office.      

## 2017-09-03 NOTE — Progress Notes (Signed)
    MRN: [id number] DOB: [birth year]  Subjective:   Richard Walters Seeberger is a 47 y.o. male presenting for routine STI screening.  Patient reports that he always uses condoms for protection.  Denies any worrisome sexual activity. Denies fever, dysuria, hematuria, urinary frequency, penile discharge, penile swelling, testicular pain, testicular swelling, anal pain, groin pain.   Algernon HuxleyGlen He is not currently taking any medications and has no known food or drug allergies.  Denies past medical and surgical history.  Objective:   Vitals: BP 129/85   Pulse (!) 55   Temp (!) 97.5 F (36.4 C) (Oral)   Resp 16   Ht 6\' 1"  (1.854 m)   Wt 177 lb (80.3 kg)   SpO2 100%   BMI 23.35 kg/m   Physical Exam  Constitutional: He is oriented to person, place, and time. He appears well-developed and well-nourished.  Cardiovascular: Normal rate.  Pulmonary/Chest: Effort normal.  Neurological: He is alert and oriented to person, place, and time.   Assessment and Plan :   Worries - Plan: RPR, GC/Chlamydia Probe Amp, Trichomonas vaginalis, RNA, HIV antibody  Routine screening for STI (sexually transmitted infection)  Labs pending, will treat as appropriate.  Wallis BambergMario Jeremey Bascom, PA-C [facility] Care at [facility] [phone] 09/03/2017  5:46 PM

## 2017-09-04 LAB — HIV ANTIBODY (ROUTINE TESTING W REFLEX): HIV SCREEN 4TH GENERATION: NONREACTIVE

## 2017-09-04 LAB — RPR: RPR: NONREACTIVE

## 2017-09-05 LAB — TRICHOMONAS VAGINALIS, PROBE AMP: Trich vag by NAA: NEGATIVE

## 2017-09-05 LAB — GC/CHLAMYDIA PROBE AMP
Chlamydia trachomatis, NAA: NEGATIVE
Neisseria gonorrhoeae by PCR: NEGATIVE

## 2017-11-21 ENCOUNTER — Telehealth: Payer: Self-pay | Admitting: Family Medicine

## 2017-11-21 NOTE — Telephone Encounter (Signed)
Called pt to try and reschedule appt with Dr. Clelia Croft. Left VM for pt to call the office and reschedule an appt. When pt calls back, please reschedule at their convenience for:   Richard Walters patient/CPE/ last cpe 11/26/16  Thank you!

## 2017-12-02 ENCOUNTER — Other Ambulatory Visit: Payer: Self-pay

## 2017-12-02 ENCOUNTER — Ambulatory Visit (INDEPENDENT_AMBULATORY_CARE_PROVIDER_SITE_OTHER): Payer: BLUE CROSS/BLUE SHIELD | Admitting: Emergency Medicine

## 2017-12-02 ENCOUNTER — Encounter: Payer: Self-pay | Admitting: Emergency Medicine

## 2017-12-02 ENCOUNTER — Encounter: Payer: BLUE CROSS/BLUE SHIELD | Admitting: Family Medicine

## 2017-12-02 VITALS — BP 111/72 | HR 66 | Temp 98.1°F | Resp 16 | Ht 73.0 in | Wt 175.6 lb

## 2017-12-02 DIAGNOSIS — Z1329 Encounter for screening for other suspected endocrine disorder: Secondary | ICD-10-CM

## 2017-12-02 DIAGNOSIS — Z1321 Encounter for screening for nutritional disorder: Secondary | ICD-10-CM

## 2017-12-02 DIAGNOSIS — Z13228 Encounter for screening for other metabolic disorders: Secondary | ICD-10-CM | POA: Diagnosis not present

## 2017-12-02 DIAGNOSIS — Z130 Encounter for screening for diseases of the blood and blood-forming organs and certain disorders involving the immune mechanism: Secondary | ICD-10-CM

## 2017-12-02 DIAGNOSIS — Z0000 Encounter for general adult medical examination without abnormal findings: Secondary | ICD-10-CM

## 2017-12-02 LAB — COMPREHENSIVE METABOLIC PANEL
ALBUMIN: 4.3 g/dL (ref 3.5–5.5)
ALK PHOS: 59 IU/L (ref 39–117)
ALT: 28 IU/L (ref 0–44)
AST: 41 IU/L — ABNORMAL HIGH (ref 0–40)
Albumin/Globulin Ratio: 2 (ref 1.2–2.2)
BUN/Creatinine Ratio: 13 (ref 9–20)
BUN: 15 mg/dL (ref 6–24)
Bilirubin Total: 0.3 mg/dL (ref 0.0–1.2)
CO2: 23 mmol/L (ref 20–29)
CREATININE: 1.12 mg/dL (ref 0.76–1.27)
Calcium: 8.9 mg/dL (ref 8.7–10.2)
Chloride: 105 mmol/L (ref 96–106)
GFR calc Af Amer: 92 mL/min/{1.73_m2} (ref >59)
GFR calc non Af Amer: 79 mL/min/{1.73_m2} (ref >59)
GLOBULIN, TOTAL: 2.2 g/dL (ref 1.5–4.5)
Glucose: 95 mg/dL (ref 65–99)
Potassium: 4.2 mmol/L (ref 3.5–5.2)
Sodium: 142 mmol/L (ref 134–144)
Total Protein: 6.5 g/dL (ref 6.0–8.5)

## 2017-12-02 LAB — CBC WITH DIFFERENTIAL/PLATELET
BASOS: 1 %
Basophils Absolute: 0 10*3/uL (ref 0.0–0.2)
EOS (ABSOLUTE): 0.4 10*3/uL (ref 0.0–0.4)
Eos: 9 %
HEMOGLOBIN: 14.8 g/dL (ref 13.0–17.7)
Hematocrit: 45.8 % (ref 37.5–51.0)
Immature Grans (Abs): 0 10*3/uL (ref 0.0–0.1)
Immature Granulocytes: 0 %
LYMPHS ABS: 1 10*3/uL (ref 0.7–3.1)
LYMPHS: 24 %
MCH: 29.7 pg (ref 26.6–33.0)
MCHC: 32.3 g/dL (ref 31.5–35.7)
MCV: 92 fL (ref 79–97)
Monocytes Absolute: 0.6 10*3/uL (ref 0.1–0.9)
Monocytes: 16 %
NEUTROS ABS: 2 10*3/uL (ref 1.4–7.0)
NEUTROS PCT: 50 %
Platelets: 230 10*3/uL (ref 150–450)
RBC: 4.99 x10E6/uL (ref 4.14–5.80)
RDW: 14.4 % (ref 12.3–15.4)
WBC: 4 10*3/uL (ref 3.4–10.8)

## 2017-12-02 LAB — HEMOGLOBIN A1C
Est. average glucose Bld gHb Est-mCnc: 123 mg/dL
HEMOGLOBIN A1C: 5.9 % — AB (ref 4.8–5.6)

## 2017-12-02 LAB — LIPID PANEL
Chol/HDL Ratio: 2.9 ratio (ref 0.0–5.0)
Cholesterol, Total: 162 mg/dL (ref 100–199)
HDL: 56 mg/dL (ref >39)
LDL Calculated: 95 mg/dL (ref 0–99)
Triglycerides: 53 mg/dL (ref 0–149)
VLDL CHOLESTEROL CAL: 11 mg/dL (ref 5–40)

## 2017-12-02 NOTE — Patient Instructions (Addendum)

## 2017-12-02 NOTE — Progress Notes (Signed)
Richard Walters 47 y.o.   Chief Complaint  Patient presents with  . Annual Exam    HISTORY OF PRESENT ILLNESS: This is a 47 y.o. male Here for annual exam; no complaints and no medical concerns. Smoking: No Drinking: Social Sleeping: 6 7 hours Adequate for him Work: Regular hours Exercise: Adequate Stress: 5 Nutrition: Good   HPI   Prior to Admission medications   Medication Sig Start Date End Date Taking? Authorizing Provider  Multiple Vitamin (MULTIVITAMIN) tablet Take 1 tablet by mouth daily.   Yes [provider]    No Known Allergies  There are no active problems to display for this patient.   No past medical history on file.  No past surgical history on file.  Social History   Socioeconomic History  . Marital status: Single    Spouse name: Not on file  . Number of children: Not on file  . Years of education: Not on file  . Highest education level: Not on file  Occupational History  . Occupation: Location managermachine operator  Social Needs  . Financial resource strain: Not on file  . Food insecurity:    Worry: Not on file    Inability: Not on file  . Transportation needs:    Medical: Not on file    Non-medical: Not on file  Tobacco Use  . Smoking status: Never Smoker  . Smokeless tobacco: Never Used  Substance and Sexual Activity  . Alcohol use: Yes    Alcohol/week: 4.0 standard drinks    Types: 4 Standard drinks or equivalent per week    Comment: BEER and WINE  . Drug use: No  . Sexual activity: Yes    Birth control/protection: None  Lifestyle  . Physical activity:    Days per week: Not on file    Minutes per session: Not on file  . Stress: Not on file  Relationships  . Social connections:    Talks on phone: Not on file    Gets together: Not on file    Attends religious service: Not on file    Active member of club or organization: Not on file    Attends meetings of clubs or organizations: Not on file    Relationship status: Not on  file  . Intimate partner violence:    Fear of current or ex partner: Not on file    Emotionally abused: Not on file    Physically abused: Not on file    Forced sexual activity: Not on file  Other Topics Concern  . Not on file  Social History Narrative   Exercise running, weights, and cardio    Family History  Problem Relation Age of Onset  . Hypertension Father   . Anemia Sister   . Diabetes Paternal Grandmother   . Hyperlipidemia Paternal Grandmother      Review of Systems  Constitutional: Negative.  Negative for chills, fever and weight loss.  HENT: Negative.  Negative for hearing loss and sore throat.   Eyes: Negative.  Negative for blurred vision and double vision.  Respiratory: Negative.  Negative for cough and shortness of breath.   Cardiovascular: Negative.  Negative for chest pain and palpitations.  Gastrointestinal: Negative.  Negative for abdominal pain, blood in stool, diarrhea, melena, nausea and vomiting.  Genitourinary: Negative.  Negative for dysuria, flank pain and hematuria.  Musculoskeletal: Negative.  Negative for back pain, myalgias and neck pain.  Skin: Negative.  Negative for rash.  Neurological: Negative.  Negative for dizziness  and headaches.  Endo/Heme/Allergies: Negative.   All other systems reviewed and are negative.   Vitals:   12/02/17 0805  BP: 111/72  Pulse: 66  Resp: 16  Temp: 98.1 F (36.7 C)  SpO2: 97%    Physical Exam  Constitutional: He is oriented to person, place, and time. He appears well-developed and well-nourished.  HENT:  Head: Normocephalic and atraumatic.  Right Ear: External ear normal.  Left Ear: External ear normal.  Nose: Nose normal.  Mouth/Throat: Oropharynx is clear and moist.  Eyes: Pupils are equal, round, and reactive to light. Conjunctivae and EOM are normal.  Neck: Normal range of motion. Neck supple. No JVD present.  Cardiovascular: Normal rate, regular rhythm and normal heart sounds.  Pulmonary/Chest:  Effort normal and breath sounds normal.  Abdominal: Soft. Bowel sounds are normal. He exhibits no distension. There is no tenderness.  Musculoskeletal: Normal range of motion. He exhibits no edema or tenderness.  Lymphadenopathy:    He has no cervical adenopathy.  Neurological: He is alert and oriented to person, place, and time. No cranial nerve deficit or sensory deficit. He exhibits normal muscle tone. Coordination normal.  Skin: Skin is warm and dry. Capillary refill takes less than 2 seconds. No rash noted.  Psychiatric: He has a normal mood and affect. His behavior is normal.  Vitals reviewed.    ASSESSMENT & PLAN: No medical issues identified today.  In good general medical condition.  [name] was seen today for annual exam.  Diagnoses and all orders for this visit:  Screening for endocrine, nutritional, metabolic and immunity disorder -     CBC with Differential -     Comprehensive metabolic panel -     Hemoglobin A1c -     Lipid panel  Routine general medical examination at a health care facility -     CBC with Differential -     Comprehensive metabolic panel -     Hemoglobin A1c -     Lipid panel    Patient Instructions       If you have lab work done today you will be contacted with your lab results within the next 2 weeks.  If you have not heard from [place] then please contact us. The fastest way to get your results is to register for My Chart.   IF you received an x-ray today, you will receive an invoice from [facility]. Please contact [facility] at [phone] with questions or concerns regarding your invoice.   IF you received labwork today, you will receive an invoice from [facility]. Please contact [facility] at [phone] with questions or concerns regarding your invoice.   Our billing staff will not be able to assist you with questions regarding bills from these companies.  You will be contacted with the lab results as soon as they are  available. The fastest way to get your results is to activate your My Chart account. Instructions are located on the last page of this paperwork. If you have not heard from [place] regarding the results in 2 weeks, please contact this office.      Health Maintenance, Male A healthy lifestyle and preventive care is important for your health and wellness. Ask your health care provider about what schedule of regular examinations is right for you. What should I know about weight and diet? Eat a Healthy Diet  Eat plenty of vegetables, fruits, whole grains, low-fat dairy products, and lean protein.  Do not eat a lot of foods high  in solid fats, added sugars, or salt.  Maintain a Healthy Weight Regular exercise can help you achieve or maintain a healthy weight. You should:  Do at least 150 minutes of exercise each week. The exercise should increase your heart rate and make you sweat (moderate-intensity exercise).  Do strength-training exercises at least twice a week.  Watch Your Levels of Cholesterol and Blood Lipids  Have your blood tested for lipids and cholesterol every 5 years starting at 45 years of age. If you are at high risk for heart disease, you should start having your blood tested when you are 45 years old. You may need to have your cholesterol levels checked more often if: ? Your lipid or cholesterol levels are high. ? You are older than 45 years of age. ? You are at high risk for heart disease.  What should I know about cancer screening? Many types of cancers can be detected early and may often be prevented. Lung Cancer  You should be screened every year for lung cancer if: ? You are a current smoker who has smoked for at least 30 years. ? You are a former smoker who has quit within the past 15 years.  Talk to your health care provider about your screening options, when you should start screening, and how often you should be screened.  Colorectal Cancer  Routine colorectal  cancer screening usually begins at 45 years of age and should be repeated every 5-10 years until you are 45 years old. You may need to be screened more often if early forms of precancerous polyps or small growths are found. Your health care provider may recommend screening at an earlier age if you have risk factors for colon cancer.  Your health care provider may recommend using home test kits to check for hidden blood in the stool.  A small camera at the end of a tube can be used to examine your colon (sigmoidoscopy or colonoscopy). This checks for the earliest forms of colorectal cancer.  Prostate and Testicular Cancer  Depending on your age and overall health, your health care provider may do certain tests to screen for prostate and testicular cancer.  Talk to your health care provider about any symptoms or concerns you have about testicular or prostate cancer.  Skin Cancer  Check your skin from head to toe regularly.  Tell your health care provider about any new moles or changes in moles, especially if: ? There is a change in a mole's size, shape, or color. ? You have a mole that is larger than a pencil eraser.  Always use sunscreen. Apply sunscreen liberally and repeat throughout the day.  Protect yourself by wearing long sleeves, pants, a wide-brimmed hat, and sunglasses when outside.  What should I know about heart disease, diabetes, and high blood pressure?  If you are 42-54 years of age, have your blood pressure checked every 3-5 years. If you are 76 years of age or older, have your blood pressure checked every year. You should have your blood pressure measured twice-once when you are at a hospital or clinic, and once when you are not at a hospital or clinic. Record the average of the two measurements. To check your blood pressure when you are not at a hospital or clinic, you can use: ? An automated blood pressure machine at a pharmacy. ? A home blood pressure monitor.  Talk to  your health care provider about your target blood pressure.  If you are between 44-79  years old, ask your health care provider if you should take aspirin to prevent heart disease.  Have regular diabetes screenings by checking your fasting blood sugar level. ? If you are at a normal weight and have a low risk for diabetes, have this test once every three years after the age of 9. ? If you are overweight and have a high risk for diabetes, consider being tested at a younger age or more often.  A one-time screening for abdominal aortic aneurysm (AAA) by ultrasound is recommended for men aged 65-75 years who are current or former smokers. What should I know about preventing infection? Hepatitis B If you have a higher risk for hepatitis B, you should be screened for this virus. Talk with your health care provider to find out if you are at risk for hepatitis B infection. Hepatitis C Blood testing is recommended for:  Everyone born from [birth year] through 1963.  Anyone with known risk factors for hepatitis C.  Sexually Transmitted Diseases (STDs)  You should be screened each year for STDs including gonorrhea and chlamydia if: ? You are sexually active and are younger than 45 years of age. ? You are older than 45 years of age and your health care provider tells you that you are at risk for this type of infection. ? Your sexual activity has changed since you were last screened and you are at an increased risk for chlamydia or gonorrhea. Ask your health care provider if you are at risk.  Talk with your health care provider about whether you are at high risk of being infected with HIV. Your health care provider may recommend a prescription medicine to help prevent HIV infection.  What else can I do?  Schedule regular health, dental, and eye exams.  Stay current with your vaccines (immunizations).  Do not use any tobacco products, such as cigarettes, chewing tobacco, and e-cigarettes. If you need  help quitting, ask your health care provider.  Limit alcohol intake to no more than 2 drinks per day. One drink equals 12 ounces of beer, 5 ounces of wine, or 1 ounces of hard liquor.  Do not use street drugs.  Do not share needles.  Ask your health care provider for help if you need support or information about quitting drugs.  Tell your health care provider if you often feel depressed.  Tell your health care provider if you have ever been abused or do not feel safe at home. This information is not intended to replace advice given to you by your health care provider. Make sure you discuss any questions you have with your health care provider. Document Released: 09/01/2007 Document Revised: 11/02/2015 Document Reviewed: 12/07/2014 Elsevier Interactive Patient Education  2018 Elsevier Inc.      Edwina Barth, MD [facility]

## 2017-12-03 ENCOUNTER — Encounter: Payer: Self-pay | Admitting: Emergency Medicine

## 2018-12-04 ENCOUNTER — Other Ambulatory Visit: Payer: Self-pay

## 2018-12-04 ENCOUNTER — Ambulatory Visit (INDEPENDENT_AMBULATORY_CARE_PROVIDER_SITE_OTHER): Payer: BC Managed Care – PPO | Admitting: Emergency Medicine

## 2018-12-04 ENCOUNTER — Encounter: Payer: Self-pay | Admitting: Emergency Medicine

## 2018-12-04 VITALS — BP 114/73 | HR 63 | Temp 97.8°F | Resp 16 | Ht 73.0 in | Wt 176.0 lb

## 2018-12-04 DIAGNOSIS — Z0000 Encounter for general adult medical examination without abnormal findings: Secondary | ICD-10-CM

## 2018-12-04 DIAGNOSIS — Z1329 Encounter for screening for other suspected endocrine disorder: Secondary | ICD-10-CM

## 2018-12-04 DIAGNOSIS — Z23 Encounter for immunization: Secondary | ICD-10-CM | POA: Diagnosis not present

## 2018-12-04 DIAGNOSIS — Z130 Encounter for screening for diseases of the blood and blood-forming organs and certain disorders involving the immune mechanism: Secondary | ICD-10-CM | POA: Diagnosis not present

## 2018-12-04 DIAGNOSIS — Z13220 Encounter for screening for lipoid disorders: Secondary | ICD-10-CM | POA: Diagnosis not present

## 2018-12-04 DIAGNOSIS — Z13228 Encounter for screening for other metabolic disorders: Secondary | ICD-10-CM

## 2018-12-04 NOTE — Patient Instructions (Addendum)
   If you have lab work done today you will be contacted with your lab results within the next 2 weeks.  If you have not heard from us then please contact us. The fastest way to get your results is to register for My Chart.   IF you received an x-ray today, you will receive an invoice from [facility]. Please contact [facility] at [phone] with questions or concerns regarding your invoice.   IF you received labwork today, you will receive an invoice from [facility]. Please contact [facility] at [phone] with questions or concerns regarding your invoice.   Our billing staff will not be able to assist you with questions regarding bills from these companies.  You will be contacted with the lab results as soon as they are available. The fastest way to get your results is to activate your My Chart account. Instructions are located on the last page of this paperwork. If you have not heard from us regarding the results in 2 weeks, please contact this office.      Health Maintenance, Male Adopting a healthy lifestyle and getting preventive care are important in promoting health and wellness. Ask your health care provider about:  The right schedule for you to have regular tests and exams.  Things you can do on your own to prevent diseases and keep yourself healthy. What should I know about diet, weight, and exercise? Eat a healthy diet   Eat a diet that includes plenty of vegetables, fruits, low-fat dairy products, and lean protein.  Do not eat a lot of foods that are high in solid fats, added sugars, or sodium. Maintain a healthy weight Body mass index (BMI) is a measurement that can be used to identify possible weight problems. It estimates body fat based on height and weight. Your health care provider can help determine your BMI and help you achieve or maintain a healthy weight. Get regular exercise Get regular exercise. This is one of the most important things you  can do for your health. Most adults should:  Exercise for at least 150 minutes each week. The exercise should increase your heart rate and make you sweat (moderate-intensity exercise).  Do strengthening exercises at least twice a week. This is in addition to the moderate-intensity exercise.  Spend less time sitting. Even light physical activity can be beneficial. Watch cholesterol and blood lipids Have your blood tested for lipids and cholesterol at 46 years of age, then have this test every 5 years. You may need to have your cholesterol levels checked more often if:  Your lipid or cholesterol levels are high.  You are older than 46 years of age.  You are at high risk for heart disease. What should I know about cancer screening? Many types of cancers can be detected early and may often be prevented. Depending on your health history and family history, you may need to have cancer screening at various ages. This may include screening for:  Colorectal cancer.  Prostate cancer.  Skin cancer.  Lung cancer. What should I know about heart disease, diabetes, and high blood pressure? Blood pressure and heart disease  High blood pressure causes heart disease and increases the risk of stroke. This is more likely to develop in people who have high blood pressure readings, are of African descent, or are overweight.  Talk with your health care provider about your target blood pressure readings.  Have your blood pressure checked: ? Every 3-5 years if you are 18-39   years of age. ? Every year if you are 46 years old or older.  If you are between the ages of 9265 and 6275 and are a current or former smoker, ask your health care provider if you should have a one-time screening for abdominal aortic aneurysm (AAA). Diabetes Have regular diabetes screenings. This checks your fasting blood sugar level. Have the screening done:  Once every three years after age 46 if you are at a normal weight and have  a low risk for diabetes.  More often and at a younger age if you are overweight or have a high risk for diabetes. What should I know about preventing infection? Hepatitis B If you have a higher risk for hepatitis B, you should be screened for this virus. Talk with your health care provider to find out if you are at risk for hepatitis B infection. Hepatitis C Blood testing is recommended for:  Everyone born from [birth year] through 1963.  Anyone with known risk factors for hepatitis C. Sexually transmitted infections (STIs)  You should be screened each year for STIs, including gonorrhea and chlamydia, if: ? You are sexually active and are younger than 46 years of age. ? You are older than 46 years of age and your health care provider tells you that you are at risk for this type of infection. ? Your sexual activity has changed since you were last screened, and you are at increased risk for chlamydia or gonorrhea. Ask your health care provider if you are at risk.  Ask your health care provider about whether you are at high risk for HIV. Your health care provider may recommend a prescription medicine to help prevent HIV infection. If you choose to take medicine to prevent HIV, you should first get tested for HIV. You should then be tested every 3 months for as long as you are taking the medicine. Follow these instructions at home: Lifestyle  Do not use any products that contain nicotine or tobacco, such as cigarettes, e-cigarettes, and chewing tobacco. If you need help quitting, ask your health care provider.  Do not use street drugs.  Do not share needles.  Ask your health care provider for help if you need support or information about quitting drugs. Alcohol use  Do not drink alcohol if your health care provider tells you not to drink.  If you drink alcohol: ? Limit how much you have to 0-2 drinks a day. ? Be aware of how much alcohol is in your drink. In the [place] one drink equals one 12  oz bottle of beer (355 mL), one 5 oz glass of wine (148 mL), or one 1 oz glass of hard liquor (44 mL). General instructions  Schedule regular health, dental, and eye exams.  Stay current with your vaccines.  Tell your health care provider if: ? You often feel depressed. ? You have ever been abused or do not feel safe at home. Summary  Adopting a healthy lifestyle and getting preventive care are important in promoting health and wellness.  Follow your health care provider's instructions about healthy diet, exercising, and getting tested or screened for diseases.  Follow your health care provider's instructions on monitoring your cholesterol and blood pressure. This information is not intended to replace advice given to you by your health care provider. Make sure you discuss any questions you have with your health care provider. Document Released: 09/01/2007 Document Revised: 02/26/2018 Document Reviewed: 02/26/2018 Elsevier Patient Education  2020 [employer]  Probiotics Probiotics  are the good bacteria and yeasts that live in your body and keep your digestive system healthy. Probiotics also help your body's defense system (immune system) and protect your body against the growth of harmful bacteria. Your health care provider may recommend taking a probiotic if you are taking antibiotics or have certain medical conditions, such as:  Diarrhea.  Constipation.  Irritable bowel syndrome.  Lung infections.  Yeast infections.  Acne, eczema, and other skin conditions.  Frequent urinary tract infections. What affects the balance of bacteria in my body? The balance of good bacteria in your body can be affected by:  Antibiotic medicines. These medicines treat infections caused by bacteria. Unfortunately, they may kill the good bacteria in your body as well as the bad bacteria.  Certain medical conditions. Conditions related to an imbalance of bacteria include: ? Stomach and  intestine (gastrointestinal) infections. ? Lung infections. ? Skin infections. ? Vaginal infections. ? Inflammatory bowel diseases. ? Stomach ulcers (gastric ulcers). ? Tooth decay and gum disease (periodontal disease).  Stress.  Poor diet. What type of probiotic is right for me? Probiotics contain different types of bacteria (strains). Strains commonly found in probiotics include:  Lactobacillus.  Saccharomyces.  Bifidobacterium. Specific strains have been shown to be more effective for certain health conditions. Ask your health care provider which strain or strains you should use and how often. Probiotics come in many different forms, strain combinations, and strengths. Some may need to be refrigerated. Always read the label for storage and usage instructions. Certain foods, such as yogurt, contain probiotics. Probiotics can also be bought as a supplement at a pharmacy, health food store, or grocery store. Talk to your health care provider before starting any supplement. What are the side effects of probiotics? Some people have side effects when taking probiotics. Side effects are usually temporary and may include:  Gas.  Bloating.  Cramping. Serious side effects are rare. Follow these instructions at home:   If you are taking probiotics with antibiotics: ? Wait at least 2 hours between taking your medicine and the probiotic. ? Eat foods high in fiber, such as whole grains, beans, and vegetables. These foods can help good bacteria grow. ? Avoid certain foods as told by your health care provider. Summary  Probiotics are the good bacteria and yeasts that live in your body and keep you and your digestive system healthy.  Certain foods, such as yogurt, contain probiotics.  Probiotics can be taken as supplements. They can be bought at a pharmacy, health food store, or grocery store. They come in many different forms, strain combinations, and strengths.  Be sure to talk with  your health care provider before taking a probiotic supplement. This information is not intended to replace advice given to you by your health care provider. Make sure you discuss any questions you have with your health care provider. Document Released: 09/30/2013 Document Revised: 11/22/2017 Document Reviewed: 03/20/2017 Elsevier Patient Education  2020 [employer].

## 2018-12-04 NOTE — Progress Notes (Signed)
Richard Walters 48 y.o.   Chief Complaint  Patient presents with  . Annual Exam    HISTORY OF PRESENT ILLNESS: This is a 48 y.o. male here for his annual exam. No significant past medical history.  No chronic medical problems on no chronic medications. Non-smoker.  No EtOH abuse. Good nutrition.  Sleep can be better. Healthy male with a healthy lifestyle. No complaints or medical concerns today.  HPI   Prior to Admission medications   Medication Sig Start Date End Date Taking? Authorizing Provider  Multiple Vitamin (MULTIVITAMIN) tablet Take 1 tablet by mouth daily.   Yes [provider]    No Known Allergies  There are no active problems to display for this patient.   History reviewed. No pertinent past medical history.  History reviewed. No pertinent surgical history.  Social History   Socioeconomic History  . Marital status: Single    Spouse name: Not on file  . Number of children: Not on file  . Years of education: Not on file  . Highest education level: Not on file  Occupational History  . Occupation: Location managermachine operator  Social Needs  . Financial resource strain: Not on file  . Food insecurity    Worry: Not on file    Inability: Not on file  . Transportation needs    Medical: Not on file    Non-medical: Not on file  Tobacco Use  . Smoking status: Never Smoker  . Smokeless tobacco: Never Used  Substance and Sexual Activity  . Alcohol use: Yes    Alcohol/week: 4.0 standard drinks    Types: 4 Standard drinks or equivalent per week    Comment: BEER and WINE  . Drug use: No  . Sexual activity: Yes    Birth control/protection: None  Lifestyle  . Physical activity    Days per week: Not on file    Minutes per session: Not on file  . Stress: Not on file  Relationships  . Social Musicianconnections    Talks on phone: Not on file    Gets together: Not on file    Attends religious service: Not on file    Active member of club or organization: Not on  file    Attends meetings of clubs or organizations: Not on file    Relationship status: Not on file  . Intimate partner violence    Fear of current or ex partner: Not on file    Emotionally abused: Not on file    Physically abused: Not on file    Forced sexual activity: Not on file  Other Topics Concern  . Not on file  Social History Narrative   Exercise running, weights, and cardio    Family History  Problem Relation Age of Onset  . Hypertension Father   . Anemia Sister   . Diabetes Paternal Grandmother   . Hyperlipidemia Paternal Grandmother      Review of Systems  Constitutional: Negative.  Negative for chills and fever.  HENT: Negative for congestion and sore throat.   Eyes: Negative.   Respiratory: Negative.  Negative for cough and shortness of breath.   Cardiovascular: Negative.  Negative for chest pain and palpitations.  Gastrointestinal: Negative.  Negative for abdominal pain, blood in stool, diarrhea, nausea and vomiting.       Intolerance to dairy products  Genitourinary: Negative.   Musculoskeletal: Negative.  Negative for myalgias and neck pain.  Skin: Negative.  Negative for rash.  Neurological: Negative.  Negative for  dizziness and headaches.  Endo/Heme/Allergies: Negative.   All other systems reviewed and are negative.    Physical Exam Vitals signs reviewed.  Constitutional:      Appearance: Normal appearance.  HENT:     Head: Normocephalic.  Eyes:     Extraocular Movements: Extraocular movements intact.     Conjunctiva/sclera: Conjunctivae normal.     Pupils: Pupils are equal, round, and reactive to light.  Neck:     Musculoskeletal: Normal range of motion and neck supple. No muscular tenderness.  Cardiovascular:     Rate and Rhythm: Normal rate and regular rhythm.     Pulses: Normal pulses.     Heart sounds: Normal heart sounds.  Pulmonary:     Effort: Pulmonary effort is normal.     Breath sounds: Normal breath sounds.  Abdominal:      General: Abdomen is flat. There is no distension.     Palpations: Abdomen is soft. There is no mass.     Tenderness: There is no abdominal tenderness.  Musculoskeletal: Normal range of motion.     Right lower leg: No edema.     Left lower leg: No edema.  Lymphadenopathy:     Cervical: No cervical adenopathy.  Skin:    General: Skin is warm and dry.     Capillary Refill: Capillary refill takes less than 2 seconds.  Neurological:     General: No focal deficit present.     Mental Status: He is alert and oriented to person, place, and time.  Psychiatric:        Mood and Affect: Mood normal.        Behavior: Behavior normal.      ASSESSMENT & PLAN: Richard Walters was seen today for annual exam.  Diagnoses and all orders for this visit:  Routine general medical examination at a health care facility  Need for prophylactic vaccination and inoculation against influenza  Screening for deficiency anemia -     CBC with Differential/Platelet  Screening for lipoid disorders -     Lipid panel  Screening for endocrine, metabolic and immunity disorder -     Comprehensive metabolic panel -     Hemoglobin A1c  Other orders -     Flu Vaccine QUAD 36+ mos IM    Patient Instructions       If you have lab work done today you will be contacted with your lab results within the next 2 weeks.  If you have not heard from [place] then please contact us. The fastest way to get your results is to register for My Chart.   IF you received an x-ray today, you will receive an invoice from [facility]. Please contact [facility] at [phone] with questions or concerns regarding your invoice.   IF you received labwork today, you will receive an invoice from [place]. Please contact [facility] at [phone] with questions or concerns regarding your invoice.   Our billing staff will not be able to assist you with questions regarding bills from these companies.  You will be contacted with  the lab results as soon as they are available. The fastest way to get your results is to activate your My Chart account. Instructions are located on the last page of this paperwork. If you have not heard from [place] regarding the results in 2 weeks, please contact this office.      Health Maintenance, Male Adopting a healthy lifestyle and getting preventive care are important in promoting health and wellness. Ask  your health care provider about:  The right schedule for you to have regular tests and exams.  Things you can do on your own to prevent diseases and keep yourself healthy. What should I know about diet, weight, and exercise? Eat a healthy diet   Eat a diet that includes plenty of vegetables, fruits, low-fat dairy products, and lean protein.  Do not eat a lot of foods that are high in solid fats, added sugars, or sodium. Maintain a healthy weight Body mass index (BMI) is a measurement that can be used to identify possible weight problems. It estimates body fat based on height and weight. Your health care provider can help determine your BMI and help you achieve or maintain a healthy weight. Get regular exercise Get regular exercise. This is one of the most important things you can do for your health. Most adults should:  Exercise for at least 150 minutes each week. The exercise should increase your heart rate and make you sweat (moderate-intensity exercise).  Do strengthening exercises at least twice a week. This is in addition to the moderate-intensity exercise.  Spend less time sitting. Even light physical activity can be beneficial. Watch cholesterol and blood lipids Have your blood tested for lipids and cholesterol at 48 years of age, then have this test every 5 years. You may need to have your cholesterol levels checked more often if:  Your lipid or cholesterol levels are high.  You are older than 46 years of age.  You are at high risk for heart disease. What should I  know about cancer screening? Many types of cancers can be detected early and may often be prevented. Depending on your health history and family history, you may need to have cancer screening at various ages. This may include screening for:  Colorectal cancer.  Prostate cancer.  Skin cancer.  Lung cancer. What should I know about heart disease, diabetes, and high blood pressure? Blood pressure and heart disease  High blood pressure causes heart disease and increases the risk of stroke. This is more likely to develop in people who have high blood pressure readings, are of African descent, or are overweight.  Talk with your health care provider about your target blood pressure readings.  Have your blood pressure checked: ? Every 3-5 years if you are 4718-46 years of age. ? Every year if you are 46 years old or older.  If you are between the ages of 3365 and 8075 and are a current or former smoker, ask your health care provider if you should have a one-time screening for abdominal aortic aneurysm (AAA). Diabetes Have regular diabetes screenings. This checks your fasting blood sugar level. Have the screening done:  Once every three years after age 46 if you are at a normal weight and have a low risk for diabetes.  More often and at a younger age if you are overweight or have a high risk for diabetes. What should I know about preventing infection? Hepatitis B If you have a higher risk for hepatitis B, you should be screened for this virus. Talk with your health care provider to find out if you are at risk for hepatitis B infection. Hepatitis C Blood testing is recommended for:  Everyone born from [birth year] through 1965.  Anyone with known risk factors for hepatitis C. Sexually transmitted infections (STIs)  You should be screened each year for STIs, including gonorrhea and chlamydia, if: ? You are sexually active and are younger than 46 years of  age. ? You are older than 46 years of age and  your health care provider tells you that you are at risk for this type of infection. ? Your sexual activity has changed since you were last screened, and you are at increased risk for chlamydia or gonorrhea. Ask your health care provider if you are at risk.  Ask your health care provider about whether you are at high risk for HIV. Your health care provider may recommend a prescription medicine to help prevent HIV infection. If you choose to take medicine to prevent HIV, you should first get tested for HIV. You should then be tested every 3 months for as long as you are taking the medicine. Follow these instructions at home: Lifestyle  Do not use any products that contain nicotine or tobacco, such as cigarettes, e-cigarettes, and chewing tobacco. If you need help quitting, ask your health care provider.  Do not use street drugs.  Do not share needles.  Ask your health care provider for help if you need support or information about quitting drugs. Alcohol use  Do not drink alcohol if your health care provider tells you not to drink.  If you drink alcohol: ? Limit how much you have to 0-2 drinks a day. ? Be aware of how much alcohol is in your drink. In the [place] one drink equals one 12 oz bottle of beer (355 mL), one 5 oz glass of wine (148 mL), or one 1 oz glass of hard liquor (44 mL). General instructions  Schedule regular health, dental, and eye exams.  Stay current with your vaccines.  Tell your health care provider if: ? You often feel depressed. ? You have ever been abused or do not feel safe at home. Summary  Adopting a healthy lifestyle and getting preventive care are important in promoting health and wellness.  Follow your health care provider's instructions about healthy diet, exercising, and getting tested or screened for diseases.  Follow your health care provider's instructions on monitoring your cholesterol and blood pressure. This information is not intended to  replace advice given to you by your health care provider. Make sure you discuss any questions you have with your health care provider. Document Released: 09/01/2007 Document Revised: 02/26/2018 Document Reviewed: 02/26/2018 Elsevier Patient Education  [place].  Probiotics Probiotics are the good bacteria and yeasts that live in your body and keep your digestive system healthy. Probiotics also help your body's defense system (immune system) and protect your body against the growth of harmful bacteria. Your health care provider may recommend taking a probiotic if you are taking antibiotics or have certain medical conditions, such as:  Diarrhea.  Constipation.  Irritable bowel syndrome.  Lung infections.  Yeast infections.  Acne, eczema, and other skin conditions.  Frequent urinary tract infections. What affects the balance of bacteria in my body? The balance of good bacteria in your body can be affected by:  Antibiotic medicines. These medicines treat infections caused by bacteria. Unfortunately, they may kill the good bacteria in your body as well as the bad bacteria.  Certain medical conditions. Conditions related to an imbalance of bacteria include: ? Stomach and intestine (gastrointestinal) infections. ? Lung infections. ? Skin infections. ? Vaginal infections. ? Inflammatory bowel diseases. ? Stomach ulcers (gastric ulcers). ? Tooth decay and gum disease (periodontal disease).  Stress.  Poor diet. What type of probiotic is right for me? Probiotics contain different types of bacteria (strains). Strains commonly found in probiotics include:  Lactobacillus.  Saccharomyces.  Bifidobacterium. Specific strains have been shown to be more effective for certain health conditions. Ask your health care provider which strain or strains you should use and how often. Probiotics come in many different forms, strain combinations, and strengths. Some may need to be  refrigerated. Always read the label for storage and usage instructions. Certain foods, such as yogurt, contain probiotics. Probiotics can also be bought as a supplement at a pharmacy, health food store, or grocery store. Talk to your health care provider before starting any supplement. What are the side effects of probiotics? Some people have side effects when taking probiotics. Side effects are usually temporary and may include:  Gas.  Bloating.  Cramping. Serious side effects are rare. Follow these instructions at home:   If you are taking probiotics with antibiotics: ? Wait at least 2 hours between taking your medicine and the probiotic. ? Eat foods high in fiber, such as whole grains, beans, and vegetables. These foods can help good bacteria grow. ? Avoid certain foods as told by your health care provider. Summary  Probiotics are the good bacteria and yeasts that live in your body and keep you and your digestive system healthy.  Certain foods, such as yogurt, contain probiotics.  Probiotics can be taken as supplements. They can be bought at a pharmacy, health food store, or grocery store. They come in many different forms, strain combinations, and strengths.  Be sure to talk with your health care provider before taking a probiotic supplement. This information is not intended to replace advice given to you by your health care provider. Make sure you discuss any questions you have with your health care provider. Document Released: 09/30/2013 Document Revised: 11/22/2017 Document Reviewed: 03/20/2017 Elsevier Patient Education  2020 Elsevier Inc.      Edwina Barth, MD [facility]

## 2018-12-05 LAB — CBC WITH DIFFERENTIAL/PLATELET
Basophils Absolute: 0.1 10*3/uL (ref 0.0–0.2)
Basos: 2 %
EOS (ABSOLUTE): 0.2 10*3/uL (ref 0.0–0.4)
Eos: 7 %
Hematocrit: 45.4 % (ref 37.5–51.0)
Hemoglobin: 15.4 g/dL (ref 13.0–17.7)
Immature Grans (Abs): 0 10*3/uL (ref 0.0–0.1)
Immature Granulocytes: 0 %
Lymphocytes Absolute: 0.8 10*3/uL (ref 0.7–3.1)
Lymphs: 25 %
MCH: 29.9 pg (ref 26.6–33.0)
MCHC: 33.9 g/dL (ref 31.5–35.7)
MCV: 88 fL (ref 79–97)
Monocytes Absolute: 0.4 10*3/uL (ref 0.1–0.9)
Monocytes: 13 %
Neutrophils Absolute: 1.8 10*3/uL (ref 1.4–7.0)
Neutrophils: 53 %
Platelets: 211 10*3/uL (ref 150–450)
RBC: 5.15 x10E6/uL (ref 4.14–5.80)
RDW: 12.7 % (ref 11.6–15.4)
WBC: 3.2 10*3/uL — ABNORMAL LOW (ref 3.4–10.8)

## 2018-12-05 LAB — COMPREHENSIVE METABOLIC PANEL
ALT: 22 IU/L (ref 0–44)
AST: 22 IU/L (ref 0–40)
Albumin/Globulin Ratio: 2.2 (ref 1.2–2.2)
Albumin: 4.8 g/dL (ref 4.0–5.0)
Alkaline Phosphatase: 55 IU/L (ref 39–117)
BUN/Creatinine Ratio: 10 (ref 9–20)
BUN: 11 mg/dL (ref 6–24)
Bilirubin Total: 0.5 mg/dL (ref 0.0–1.2)
CO2: 22 mmol/L (ref 20–29)
Calcium: 9.5 mg/dL (ref 8.7–10.2)
Chloride: 104 mmol/L (ref 96–106)
Creatinine, Ser: 1.12 mg/dL (ref 0.76–1.27)
GFR calc Af Amer: 91 mL/min/{1.73_m2} (ref >59)
GFR calc non Af Amer: 79 mL/min/{1.73_m2} (ref >59)
Globulin, Total: 2.2 g/dL (ref 1.5–4.5)
Glucose: 72 mg/dL (ref 65–99)
Potassium: 4.3 mmol/L (ref 3.5–5.2)
Sodium: 142 mmol/L (ref 134–144)
Total Protein: 7 g/dL (ref 6.0–8.5)

## 2018-12-05 LAB — HEMOGLOBIN A1C
Est. average glucose Bld gHb Est-mCnc: 117 mg/dL
Hgb A1c MFr Bld: 5.7 % — ABNORMAL HIGH (ref 4.8–5.6)

## 2018-12-05 LAB — LIPID PANEL
Chol/HDL Ratio: 3.4 ratio (ref 0.0–5.0)
Cholesterol, Total: 171 mg/dL (ref 100–199)
HDL: 51 mg/dL (ref >39)
LDL Chol Calc (NIH): 110 mg/dL — ABNORMAL HIGH (ref 0–99)
Triglycerides: 47 mg/dL (ref 0–149)
VLDL Cholesterol Cal: 10 mg/dL (ref 5–40)

## 2018-12-07 ENCOUNTER — Encounter: Payer: Self-pay | Admitting: Emergency Medicine

## 2019-12-09 ENCOUNTER — Ambulatory Visit (INDEPENDENT_AMBULATORY_CARE_PROVIDER_SITE_OTHER): Payer: BC Managed Care – PPO | Admitting: Emergency Medicine

## 2019-12-09 ENCOUNTER — Other Ambulatory Visit: Payer: Self-pay

## 2019-12-09 ENCOUNTER — Encounter: Payer: Self-pay | Admitting: Emergency Medicine

## 2019-12-09 VITALS — BP 124/76 | HR 57 | Temp 97.8°F | Ht 73.0 in | Wt 183.0 lb

## 2019-12-09 DIAGNOSIS — Z13228 Encounter for screening for other metabolic disorders: Secondary | ICD-10-CM

## 2019-12-09 DIAGNOSIS — Z0000 Encounter for general adult medical examination without abnormal findings: Secondary | ICD-10-CM

## 2019-12-09 DIAGNOSIS — Z13220 Encounter for screening for lipoid disorders: Secondary | ICD-10-CM | POA: Diagnosis not present

## 2019-12-09 DIAGNOSIS — Z130 Encounter for screening for diseases of the blood and blood-forming organs and certain disorders involving the immune mechanism: Secondary | ICD-10-CM | POA: Diagnosis not present

## 2019-12-09 DIAGNOSIS — Z23 Encounter for immunization: Secondary | ICD-10-CM

## 2019-12-09 DIAGNOSIS — Z1329 Encounter for screening for other suspected endocrine disorder: Secondary | ICD-10-CM

## 2019-12-09 LAB — CBC WITH DIFFERENTIAL/PLATELET
Basophils Absolute: 0.1 10*3/uL (ref 0.0–0.2)
Basos: 1 %
EOS (ABSOLUTE): 0.3 10*3/uL (ref 0.0–0.4)
Eos: 8 %
Hematocrit: 42.2 % (ref 37.5–51.0)
Hemoglobin: 14.8 g/dL (ref 13.0–17.7)
Immature Grans (Abs): 0 10*3/uL (ref 0.0–0.1)
Immature Granulocytes: 0 %
Lymphocytes Absolute: 0.7 10*3/uL (ref 0.7–3.1)
Lymphs: 20 %
MCH: 31.1 pg (ref 26.6–33.0)
MCHC: 35.1 g/dL (ref 31.5–35.7)
MCV: 89 fL (ref 79–97)
Monocytes Absolute: 0.7 10*3/uL (ref 0.1–0.9)
Monocytes: 18 %
Neutrophils Absolute: 2 10*3/uL (ref 1.4–7.0)
Neutrophils: 53 %
Platelets: 203 10*3/uL (ref 150–450)
RBC: 4.76 x10E6/uL (ref 4.14–5.80)
RDW: 13.1 % (ref 11.6–15.4)
WBC: 3.7 10*3/uL (ref 3.4–10.8)

## 2019-12-09 LAB — LIPID PANEL
Chol/HDL Ratio: 3.9 ratio (ref 0.0–5.0)
Cholesterol, Total: 171 mg/dL (ref 100–199)
HDL: 44 mg/dL (ref >39)
LDL Chol Calc (NIH): 91 mg/dL (ref 0–99)
Triglycerides: 211 mg/dL — ABNORMAL HIGH (ref 0–149)
VLDL Cholesterol Cal: 36 mg/dL (ref 5–40)

## 2019-12-09 LAB — CMP14+EGFR
ALT: 39 IU/L (ref 0–44)
AST: 29 IU/L (ref 0–40)
Albumin/Globulin Ratio: 2 (ref 1.2–2.2)
Albumin: 4.4 g/dL (ref 4.0–5.0)
Alkaline Phosphatase: 85 IU/L (ref 44–121)
BUN/Creatinine Ratio: 13 (ref 9–20)
BUN: 12 mg/dL (ref 6–24)
Bilirubin Total: 0.3 mg/dL (ref 0.0–1.2)
CO2: 23 mmol/L (ref 20–29)
Calcium: 9.3 mg/dL (ref 8.7–10.2)
Chloride: 102 mmol/L (ref 96–106)
Creatinine, Ser: 0.94 mg/dL (ref 0.76–1.27)
GFR calc Af Amer: 112 mL/min/{1.73_m2} (ref >59)
GFR calc non Af Amer: 97 mL/min/{1.73_m2} (ref >59)
Globulin, Total: 2.2 g/dL (ref 1.5–4.5)
Glucose: 86 mg/dL (ref 65–99)
Potassium: 4.4 mmol/L (ref 3.5–5.2)
Sodium: 140 mmol/L (ref 134–144)
Total Protein: 6.6 g/dL (ref 6.0–8.5)

## 2019-12-09 LAB — HEMOGLOBIN A1C
Est. average glucose Bld gHb Est-mCnc: 123 mg/dL
Hgb A1c MFr Bld: 5.9 % — ABNORMAL HIGH (ref 4.8–5.6)

## 2019-12-09 NOTE — Patient Instructions (Addendum)
   If you have lab work done today you will be contacted with your lab results within the next 2 weeks.  If you have not heard from us then please contact us. The fastest way to get your results is to register for My Chart.   IF you received an x-ray today, you will receive an invoice from [facility]. Please contact [facility] at [phone] with questions or concerns regarding your invoice.   IF you received labwork today, you will receive an invoice from [facility]. Please contact [facility] at [phone] with questions or concerns regarding your invoice.   Our billing staff will not be able to assist you with questions regarding bills from these companies.  You will be contacted with the lab results as soon as they are available. The fastest way to get your results is to activate your My Chart account. Instructions are located on the last page of this paperwork. If you have not heard from us regarding the results in 2 weeks, please contact this office.      Health Maintenance, Male Adopting a healthy lifestyle and getting preventive care are important in promoting health and wellness. Ask your health care provider about:  The right schedule for you to have regular tests and exams.  Things you can do on your own to prevent diseases and keep yourself healthy. What should I know about diet, weight, and exercise? Eat a healthy diet   Eat a diet that includes plenty of vegetables, fruits, low-fat dairy products, and lean protein.  Do not eat a lot of foods that are high in solid fats, added sugars, or sodium. Maintain a healthy weight Body mass index (BMI) is a measurement that can be used to identify possible weight problems. It estimates body fat based on height and weight. Your health care provider can help determine your BMI and help you achieve or maintain a healthy weight. Get regular exercise Get regular exercise. This is one of the most important things you  can do for your health. Most adults should:  Exercise for at least 150 minutes each week. The exercise should increase your heart rate and make you sweat (moderate-intensity exercise).  Do strengthening exercises at least twice a week. This is in addition to the moderate-intensity exercise.  Spend less time sitting. Even light physical activity can be beneficial. Watch cholesterol and blood lipids Have your blood tested for lipids and cholesterol at 47 years of age, then have this test every 5 years. You may need to have your cholesterol levels checked more often if:  Your lipid or cholesterol levels are high.  You are older than 47 years of age.  You are at high risk for heart disease. What should I know about cancer screening? Many types of cancers can be detected early and may often be prevented. Depending on your health history and family history, you may need to have cancer screening at various ages. This may include screening for:  Colorectal cancer.  Prostate cancer.  Skin cancer.  Lung cancer. What should I know about heart disease, diabetes, and high blood pressure? Blood pressure and heart disease  High blood pressure causes heart disease and increases the risk of stroke. This is more likely to develop in people who have high blood pressure readings, are of African descent, or are overweight.  Talk with your health care provider about your target blood pressure readings.  Have your blood pressure checked: ? Every 3-5 years if you are 18-39   years of age. ? Every year if you are 40 years old or older.  If you are between the ages of 65 and 75 and are a current or former smoker, ask your health care provider if you should have a one-time screening for abdominal aortic aneurysm (AAA). Diabetes Have regular diabetes screenings. This checks your fasting blood sugar level. Have the screening done:  Once every three years after age 45 if you are at a normal weight and have  a low risk for diabetes.  More often and at a younger age if you are overweight or have a high risk for diabetes. What should I know about preventing infection? Hepatitis B If you have a higher risk for hepatitis B, you should be screened for this virus. Talk with your health care provider to find out if you are at risk for hepatitis B infection. Hepatitis C Blood testing is recommended for:  Everyone born from 1945 through 1965.  Anyone with known risk factors for hepatitis C. Sexually transmitted infections (STIs)  You should be screened each year for STIs, including gonorrhea and chlamydia, if: ? You are sexually active and are younger than 47 years of age. ? You are older than 47 years of age and your health care provider tells you that you are at risk for this type of infection. ? Your sexual activity has changed since you were last screened, and you are at increased risk for chlamydia or gonorrhea. Ask your health care provider if you are at risk.  Ask your health care provider about whether you are at high risk for HIV. Your health care provider may recommend a prescription medicine to help prevent HIV infection. If you choose to take medicine to prevent HIV, you should first get tested for HIV. You should then be tested every 3 months for as long as you are taking the medicine. Follow these instructions at home: Lifestyle  Do not use any products that contain nicotine or tobacco, such as cigarettes, e-cigarettes, and chewing tobacco. If you need help quitting, ask your health care provider.  Do not use street drugs.  Do not share needles.  Ask your health care provider for help if you need support or information about quitting drugs. Alcohol use  Do not drink alcohol if your health care provider tells you not to drink.  If you drink alcohol: ? Limit how much you have to 0-2 drinks a day. ? Be aware of how much alcohol is in your drink. In the [place] one drink equals one 12  oz bottle of beer (355 mL), one 5 oz glass of wine (148 mL), or one 1 oz glass of hard liquor (44 mL). General instructions  Schedule regular health, dental, and eye exams.  Stay current with your vaccines.  Tell your health care provider if: ? You often feel depressed. ? You have ever been abused or do not feel safe at home. Summary  Adopting a healthy lifestyle and getting preventive care are important in promoting health and wellness.  Follow your health care provider's instructions about healthy diet, exercising, and getting tested or screened for diseases.  Follow your health care provider's instructions on monitoring your cholesterol and blood pressure. This information is not intended to replace advice given to you by your health care provider. Make sure you discuss any questions you have with your health care provider. Document Revised: 02/26/2018 Document Reviewed: 02/26/2018 Elsevier Patient Education  2020 Elsevier Inc.  

## 2019-12-09 NOTE — Progress Notes (Signed)
Richard Walters 49 y.o.   Chief Complaint  Patient presents with  . Annual Exam    CPE    HISTORY OF PRESENT ILLNESS: This is a 49 y.o. male here for his annual exam. Healthy male with a healthy lifestyle. No chronic medical problems, on no chronic medications. Has no complaints or medical concerns today.  HPI   Prior to Admission medications   Medication Sig Start Date End Date Taking? Authorizing Provider  Multiple Vitamin (MULTIVITAMIN) tablet Take 1 tablet by mouth daily.   Yes [provider]    No Known Allergies  There are no problems to display for this patient.   History reviewed. No pertinent past medical history.  History reviewed. No pertinent surgical history.  Social History   Socioeconomic History  . Marital status: Single    Spouse name: Not on file  . Number of children: Not on file  . Years of education: Not on file  . Highest education level: Not on file  Occupational History  . Occupation: Glass blower/designer  Tobacco Use  . Smoking status: Never Smoker  . Smokeless tobacco: Never Used  Substance and Sexual Activity  . Alcohol use: Yes    Alcohol/week: 4.0 standard drinks    Types: 4 Standard drinks or equivalent per week    Comment: BEER and WINE  . Drug use: No  . Sexual activity: Yes    Birth control/protection: None  Other Topics Concern  . Not on file  Social History Narrative   Exercise running, weights, and cardio   Social Determinants of Health   Financial Resource Strain:   . Difficulty of Paying Living Expenses: Not on file  Food Insecurity:   . Worried About Charity fundraiser in the Last Year: Not on file  . Ran Out of Food in the Last Year: Not on file  Transportation Needs:   . Lack of Transportation (Medical): Not on file  . Lack of Transportation (Non-Medical): Not on file  Physical Activity:   . Days of Exercise per Week: Not on file  . Minutes of Exercise per Session: Not on file  Stress:   .  Feeling of Stress : Not on file  Social Connections:   . Frequency of Communication with Friends and Family: Not on file  . Frequency of Social Gatherings with Friends and Family: Not on file  . Attends Religious Services: Not on file  . Active Member of Clubs or Organizations: Not on file  . Attends Archivist Meetings: Not on file  . Marital Status: Not on file  Intimate Partner Violence:   . Fear of Current or Ex-Partner: Not on file  . Emotionally Abused: Not on file  . Physically Abused: Not on file  . Sexually Abused: Not on file    Family History  Problem Relation Age of Onset  . Hypertension Father   . Anemia Sister   . Diabetes Paternal Grandmother   . Hyperlipidemia Paternal Grandmother      Review of Systems  Constitutional: Negative.  Negative for chills and fever.  HENT: Negative.  Negative for congestion and sore throat.   Respiratory: Negative.  Negative for cough and shortness of breath.   Cardiovascular: Negative.  Negative for chest pain and palpitations.  Gastrointestinal: Negative.  Negative for abdominal pain, diarrhea, nausea and vomiting.  Genitourinary: Negative.  Negative for dysuria and hematuria.  Musculoskeletal: Negative.  Negative for back pain, myalgias and neck pain.  Skin: Negative.  Negative  for rash.  Neurological: Negative.  Negative for dizziness and headaches.  All other systems reviewed and are negative.   Today's Vitals   12/09/19 0932  BP: 124/76  Pulse: (!) 57  Temp: 97.8 F (36.6 C)  TempSrc: Temporal  SpO2: 98%  Weight: 183 lb (83 kg)  Height: 6' 1"  (1.854 m)   Body mass index is 24.14 kg/m.  Physical Exam Vitals reviewed.  Constitutional:      Appearance: Normal appearance.  HENT:     Head: Normocephalic.  Eyes:     Extraocular Movements: Extraocular movements intact.     Pupils: Pupils are equal, round, and reactive to light.  Neck:     Vascular: No carotid bruit.  Cardiovascular:     Rate and  Rhythm: Normal rate and regular rhythm.     Pulses: Normal pulses.     Heart sounds: Normal heart sounds.  Pulmonary:     Effort: Pulmonary effort is normal.     Breath sounds: Normal breath sounds.  Abdominal:     General: Bowel sounds are normal. There is no distension.     Palpations: Abdomen is soft.     Tenderness: There is no abdominal tenderness.  Musculoskeletal:        General: Normal range of motion.     Cervical back: Normal range of motion and neck supple. No tenderness.  Lymphadenopathy:     Cervical: No cervical adenopathy.  Skin:    General: Skin is warm and dry.     Capillary Refill: Capillary refill takes less than 2 seconds.  Neurological:     General: No focal deficit present.     Mental Status: He is alert and oriented to person, place, and time.  Psychiatric:        Mood and Affect: Mood normal.        Behavior: Behavior normal.      ASSESSMENT & PLAN: Richard Walters was seen today for annual exam.  Diagnoses and all orders for this visit:  Routine general medical examination at a health care facility  Need for immunization against influenza -     Flu Vaccine QUAD 36+ mos IM  Screening for deficiency anemia -     CBC with Differential/Platelet  Screening for lipoid disorders -     Lipid panel  Screening for endocrine, metabolic and immunity disorder -     CMP14+EGFR -     Hemoglobin A1c  Other orders -     Cancel: Flu Vaccine QUAD 36+ mos IM    Patient Instructions       If you have lab work done today you will be contacted with your lab results within the next 2 weeks.  If you have not heard from [place] then please contact us. The fastest way to get your results is to register for My Chart.   IF you received an x-ray today, you will receive an invoice from [facility]. Please contact [facility] at [phone] with questions or concerns regarding your invoice.   IF you received labwork today, you will receive an invoice from  [facility]. Please contact [facility] at [phone] with questions or concerns regarding your invoice.   Our billing staff will not be able to assist you with questions regarding bills from these companies.  You will be contacted with the lab results as soon as they are available. The fastest way to get your results is to activate your My Chart account. Instructions are located on the last page of  this paperwork. If you have not heard from [place] regarding the results in 2 weeks, please contact this office.       Health Maintenance, Male Adopting a healthy lifestyle and getting preventive care are important in promoting health and wellness. Ask your health care provider about:  The right schedule for you to have regular tests and exams.  Things you can do on your own to prevent diseases and keep yourself healthy. What should I know about diet, weight, and exercise? Eat a healthy diet   Eat a diet that includes plenty of vegetables, fruits, low-fat dairy products, and lean protein.  Do not eat a lot of foods that are high in solid fats, added sugars, or sodium. Maintain a healthy weight Body mass index (BMI) is a measurement that can be used to identify possible weight problems. It estimates body fat based on height and weight. Your health care provider can help determine your BMI and help you achieve or maintain a healthy weight. Get regular exercise Get regular exercise. This is one of the most important things you can do for your health. Most adults should:  Exercise for at least 150 minutes each week. The exercise should increase your heart rate and make you sweat (moderate-intensity exercise).  Do strengthening exercises at least twice a week. This is in addition to the moderate-intensity exercise.  Spend less time sitting. Even light physical activity can be beneficial. Watch cholesterol and blood lipids Have your blood tested for lipids and cholesterol at 47 years of age, then have  this test every 5 years. You may need to have your cholesterol levels checked more often if:  Your lipid or cholesterol levels are high.  You are older than 47 years of age.  You are at high risk for heart disease. What should I know about cancer screening? Many types of cancers can be detected early and may often be prevented. Depending on your health history and family history, you may need to have cancer screening at various ages. This may include screening for:  Colorectal cancer.  Prostate cancer.  Skin cancer.  Lung cancer. What should I know about heart disease, diabetes, and high blood pressure? Blood pressure and heart disease  High blood pressure causes heart disease and increases the risk of stroke. This is more likely to develop in people who have high blood pressure readings, are of African descent, or are overweight.  Talk with your health care provider about your target blood pressure readings.  Have your blood pressure checked: ? Every 3-5 years if you are 41-57 years of age. ? Every year if you are 52 years old or older.  If you are between the ages of 83 and 36 and are a current or former smoker, ask your health care provider if you should have a one-time screening for abdominal aortic aneurysm (AAA). Diabetes Have regular diabetes screenings. This checks your fasting blood sugar level. Have the screening done:  Once every three years after age 75 if you are at a normal weight and have a low risk for diabetes.  More often and at a younger age if you are overweight or have a high risk for diabetes. What should I know about preventing infection? Hepatitis B If you have a higher risk for hepatitis B, you should be screened for this virus. Talk with your health care provider to find out if you are at risk for hepatitis B infection. Hepatitis C Blood testing is recommended for:  Everyone  born from [birth year] through 1963.  Anyone with known risk factors for  hepatitis C. Sexually transmitted infections (STIs)  You should be screened each year for STIs, including gonorrhea and chlamydia, if: ? You are sexually active and are younger than 47 years of age. ? You are older than 47 years of age and your health care provider tells you that you are at risk for this type of infection. ? Your sexual activity has changed since you were last screened, and you are at increased risk for chlamydia or gonorrhea. Ask your health care provider if you are at risk.  Ask your health care provider about whether you are at high risk for HIV. Your health care provider may recommend a prescription medicine to help prevent HIV infection. If you choose to take medicine to prevent HIV, you should first get tested for HIV. You should then be tested every 3 months for as long as you are taking the medicine. Follow these instructions at home: Lifestyle  Do not use any products that contain nicotine or tobacco, such as cigarettes, e-cigarettes, and chewing tobacco. If you need help quitting, ask your health care provider.  Do not use street drugs.  Do not share needles.  Ask your health care provider for help if you need support or information about quitting drugs. Alcohol use  Do not drink alcohol if your health care provider tells you not to drink.  If you drink alcohol: ? Limit how much you have to 0-2 drinks a day. ? Be aware of how much alcohol is in your drink. In the [place] one drink equals one 12 oz bottle of beer (355 mL), one 5 oz glass of wine (148 mL), or one 1 oz glass of hard liquor (44 mL). General instructions  Schedule regular health, dental, and eye exams.  Stay current with your vaccines.  Tell your health care provider if: ? You often feel depressed. ? You have ever been abused or do not feel safe at home. Summary  Adopting a healthy lifestyle and getting preventive care are important in promoting health and wellness.  Follow your health care  provider's instructions about healthy diet, exercising, and getting tested or screened for diseases.  Follow your health care provider's instructions on monitoring your cholesterol and blood pressure. This information is not intended to replace advice given to you by your health care provider. Make sure you discuss any questions you have with your health care provider. Document Revised: 02/26/2018 Document Reviewed: 02/26/2018 Elsevier Patient Education  2020 Elsevier Inc.      Agustina Caroli, MD [facility]

## 2020-12-12 ENCOUNTER — Encounter: Payer: Self-pay | Admitting: Emergency Medicine

## 2021-04-02 ENCOUNTER — Other Ambulatory Visit: Payer: Self-pay

## 2021-04-02 ENCOUNTER — Emergency Department (HOSPITAL_BASED_OUTPATIENT_CLINIC_OR_DEPARTMENT_OTHER): Payer: BC Managed Care – PPO

## 2021-04-02 ENCOUNTER — Encounter (HOSPITAL_BASED_OUTPATIENT_CLINIC_OR_DEPARTMENT_OTHER): Payer: Self-pay | Admitting: Emergency Medicine

## 2021-04-02 ENCOUNTER — Emergency Department (HOSPITAL_BASED_OUTPATIENT_CLINIC_OR_DEPARTMENT_OTHER)
Admission: EM | Admit: 2021-04-02 | Discharge: 2021-04-02 | Disposition: A | Discharge status: Home / Self Care | Payer: BC Managed Care – PPO | Attending: Emergency Medicine | Admitting: Emergency Medicine

## 2021-04-02 DIAGNOSIS — Z23 Encounter for immunization: Secondary | ICD-10-CM | POA: Diagnosis not present

## 2021-04-02 DIAGNOSIS — S91312A Laceration without foreign body, left foot, initial encounter: Secondary | ICD-10-CM | POA: Insufficient documentation

## 2021-04-02 DIAGNOSIS — S99922A Unspecified injury of left foot, initial encounter: Secondary | ICD-10-CM | POA: Diagnosis present

## 2021-04-02 DIAGNOSIS — W11XXXA Fall on and from ladder, initial encounter: Secondary | ICD-10-CM | POA: Insufficient documentation

## 2021-04-02 MED ORDER — TETANUS-DIPHTH-ACELL PERTUSSIS 5-2.5-18.5 LF-MCG/0.5 IM SUSY
0.5000 mL | PREFILLED_SYRINGE | Freq: Once | INTRAMUSCULAR | Status: AC
  Administered 2021-04-02: 0.5 mL via INTRAMUSCULAR
  Filled 2021-04-02: qty 0.5

## 2021-04-02 MED ORDER — LIDOCAINE HCL (PF) 1 % IJ SOLN
10.0000 mL | Freq: Once | INTRAMUSCULAR | Status: AC
  Administered 2021-04-02: 10 mL
  Filled 2021-04-02: qty 10

## 2021-04-02 NOTE — Discharge Instructions (Addendum)
Wash with soap and water.  Can apply topical antibiotic ointment to the wound.  Watch for signs of fever, spreading redness, pus.  These are signs to return to the ED for additional evaluation.  He can have the sutures removed in 10 days.

## 2021-04-02 NOTE — ED Notes (Signed)
Patient discharged to home.  All discharge instructions reviewed.  Patient verbalized understanding via teachback method.  VS WDL.  Respirations even and unlabored.  Ambulatory out of ED.   °

## 2021-04-02 NOTE — ED Provider Notes (Signed)
[facility] EMERGENCY DEPARTMENT Provider Note   CSN: [id number] Arrival date & time: 04/02/21  1823     History  Chief Complaint  Patient presents with   Foot Injury    Richard Walters is a 51 y.o. male.   Foot Injury Associated symptoms: no fever    Patient presents with laceration.  On his left foot, happened acutely when a ladder fell on him.  Unsure when his last tetanus was, not diabetic.  Able to move all of his toes, bleeding controlled with pressure.  Has not taken anything for pain.  No paresthesias or numbness, no point tenderness.  Home Medications Prior to Admission medications   Medication Sig Start Date End Date Taking? Authorizing Provider  Multiple Vitamin (MULTIVITAMIN) tablet Take 1 tablet by mouth daily.    [provider]      Allergies    Patient has no known allergies.    Review of Systems   Review of Systems  Constitutional:  Negative for fever.  Skin:  Positive for wound.   Physical Exam Updated Vital Signs BP (!) 145/101    Pulse 70    Temp 98.4 F (36.9 C) (Oral)    Resp 18    Ht 6\' 1"  (1.854 m)    Wt 77.1 kg    SpO2 95%    BMI 22.43 kg/m  Physical Exam Vitals and nursing note reviewed. Exam conducted with a chaperone present.  Constitutional:      General: He is not in acute distress.    Appearance: Normal appearance.  HENT:     Head: Normocephalic and atraumatic.  Eyes:     General: No scleral icterus.    Extraocular Movements: Extraocular movements intact.     Pupils: Pupils are equal, round, and reactive to light.  Cardiovascular:     Rate and Rhythm: Normal rate and regular rhythm.     Pulses: Normal pulses.  Musculoskeletal:        General: Tenderness present.  Skin:    Capillary Refill: Capillary refill takes less than 2 seconds.     Coloration: Skin is not jaundiced.     Comments: 2 cm laceration between his large toe and adjacent toe.  1 cm laceration between toes 2 and 3.  Neurological:     Mental  Status: He is alert. Mental status is at baseline.     Coordination: Coordination normal.   ED Results / Procedures / Treatments   Labs (all labs ordered are listed, but only abnormal results are displayed) Labs Reviewed - No data to display  EKG None  Radiology DG Foot Complete Left  Result Date: 04/02/2021 CLINICAL DATA:  Ladder based injury to the foot with laceration between the first and second toes, initial encounter EXAM: LEFT FOOT - COMPLETE 3+ VIEW COMPARISON:  None. FINDINGS: No acute fracture or dislocation is noted. Soft tissue laceration is noted between the first and second digit. No radiopaque foreign body is noted. IMPRESSION: No acute bony abnormality is noted. Electronically Signed   By: Inez Catalina M.D.   On: 04/02/2021 19:26    Procedures .Marland KitchenLaceration Repair  Date/Time: 04/02/2021 9:54 PM Performed by: Sherrill Raring, PA-C Authorized by: Sherrill Raring, PA-C   Consent:    Consent obtained:  Verbal   Consent given by:  Patient   Risks discussed:  Infection, need for additional repair, pain, poor cosmetic result and poor wound healing   Alternatives discussed:  No treatment and delayed treatment Universal protocol:  Procedure explained and questions answered to patient or proxy's satisfaction: yes     Relevant documents present and verified: yes     Test results available: yes     Imaging studies available: yes     Required blood products, implants, devices, and special equipment available: yes     Site/side marked: yes     Immediately prior to procedure, a time out was called: yes     Patient identity confirmed:  Verbally with patient Anesthesia:    Anesthesia method:  Local infiltration   Local anesthetic:  Lidocaine 1% w/o epi Laceration details:    Location:  Foot   Foot location:  Top of L foot   Length (cm):  2 Pre-procedure details:    Preparation:  Patient was prepped and draped in usual sterile fashion and imaging obtained to evaluate for foreign  bodies Exploration:    Hemostasis achieved with:  Direct pressure   Imaging obtained: x-ray     Imaging outcome: foreign body not noted     Wound exploration: wound explored through full range of motion and entire depth of wound visualized     Contaminated: no   Treatment:    Area cleansed with:  Povidone-iodine   Amount of cleaning:  Extensive   Irrigation solution:  Sterile saline   Irrigation volume:  500   Irrigation method:  Pressure wash   Visualized foreign bodies/material removed: no     Debridement:  None   Undermining:  None Skin repair:    Repair method:  Sutures   Suture size:  5-0   Suture material:  Prolene   Suture technique:  Simple interrupted   Number of sutures:  7 Approximation:    Approximation:  Close Repair type:    Repair type:  Simple Post-procedure details:    Dressing:  Open (no dressing)    Medications Ordered in ED Medications  lidocaine (PF) (XYLOCAINE) 1 % injection 10 mL (10 mLs Infiltration Given 04/02/21 2115)  Tdap (BOOSTRIX) injection 0.5 mL (0.5 mLs Intramuscular Given 04/02/21 2114)    ED Course/ Medical Decision Making/ A&P                           Medical Decision Making  This is a 51 year old male presenting due to laceration secondary to letter.  Reviewed the radiographs ordered in triage, I agree with radiologist dictation.  No acute fractures.  He is neurovascularly intact with good cap refill, strong DP and PT.  Lacerations noted, repaired.  Patient tolerated procedure well.  He is not diabetic immunocompromise, do not feel he would benefit from antibiotics.  Will update his tetanus, advised to follow-up with PCP.  Return precautions advised.  Discharged in stable condition.        Final Clinical Impression(s) / ED Diagnoses Final diagnoses:  Laceration of left foot, initial encounter    Rx / DC Orders ED Discharge Orders     None         Sherrill Raring, PA-C 04/02/21 2325    Tegeler, Gwenyth Allegra,  MD 04/02/21 [phone]

## 2021-04-02 NOTE — ED Triage Notes (Signed)
Pt arrives pov with c/o left foot injury by ladder. Laceration noted to 2nd toe and between 1st and 2nd toe. Bleeding controlled, bandage applied in triage. Bruising and swelling noted to foot at base of 2nd digit.

## 2021-04-06 ENCOUNTER — Telehealth: Payer: Managed Care, Other (non HMO)

## 2021-04-11 ENCOUNTER — Other Ambulatory Visit: Payer: Self-pay

## 2021-04-11 ENCOUNTER — Ambulatory Visit
Admission: EM | Admit: 2021-04-11 | Discharge: 2021-04-11 | Disposition: A | Discharge status: Home / Self Care | Payer: Managed Care, Other (non HMO) | Attending: Emergency Medicine | Admitting: Emergency Medicine

## 2021-04-11 DIAGNOSIS — L089 Local infection of the skin and subcutaneous tissue, unspecified: Secondary | ICD-10-CM

## 2021-04-11 DIAGNOSIS — Z4802 Encounter for removal of sutures: Secondary | ICD-10-CM

## 2021-04-11 DIAGNOSIS — S91312D Laceration without foreign body, left foot, subsequent encounter: Secondary | ICD-10-CM | POA: Diagnosis not present

## 2021-04-11 DIAGNOSIS — L03116 Cellulitis of left lower limb: Secondary | ICD-10-CM | POA: Diagnosis not present

## 2021-04-11 DIAGNOSIS — S91312S Laceration without foreign body, left foot, sequela: Secondary | ICD-10-CM

## 2021-04-11 DIAGNOSIS — T148XXD Other injury of unspecified body region, subsequent encounter: Secondary | ICD-10-CM

## 2021-04-11 MED ORDER — CEFTRIAXONE SODIUM 1 G IJ SOLR
1.0000 g | Freq: Once | INTRAMUSCULAR | Status: AC
  Administered 2021-04-11: 12:00:00 1 g via INTRAMUSCULAR

## 2021-04-11 MED ORDER — SULFAMETHOXAZOLE-TRIMETHOPRIM 800-160 MG PO TABS
1.0000 | ORAL_TABLET | Freq: Two times a day (BID) | ORAL | 0 refills | Status: AC
Start: 2021-04-11 — End: 2021-04-18

## 2021-04-11 NOTE — ED Provider Notes (Signed)
[facility]    CSN: [id number] Arrival date & time: 04/11/21  1040    HISTORY  No chief complaint on file.  HPI Richard Walters is a 51 y.o. male. Patient was seen in the emergency room on April 02, 2021 after he dropped a ladder on his left foot and suffered a laceration.  Wound was cleaned and closed with 7 interrupted Prolene sutures.  Despite the infection being in his foot, patient was not provided with antibiotics, patient states that the wound is draining purulent material, he has significantly increased pain in the area surrounding laceration and there is diffuse swelling and erythema, mild red streaking up his left leg.  The history is provided by the patient.  History reviewed. No pertinent past medical history. There are no problems to display for this patient.  History reviewed. No pertinent surgical history.  Home Medications    Prior to Admission medications   Medication Sig Start Date End Date Taking? Authorizing Provider  Multiple Vitamin (MULTIVITAMIN) tablet Take 1 tablet by mouth daily.    [provider]    Family History Family History  Problem Relation Age of Onset   Hypertension Father    Anemia Sister    Diabetes Paternal Grandmother    Hyperlipidemia Paternal Grandmother    Social History Social History   Tobacco Use   Smoking status: Never   Smokeless tobacco: Never  Substance Use Topics   Alcohol use: Yes    Alcohol/week: 4.0 standard drinks    Types: 4 Standard drinks or equivalent per week    Comment: BEER and WINE   Drug use: No   Allergies   Patient has no known allergies.  Review of Systems Review of Systems Pertinent findings noted in history of present illness.   Physical Exam Triage Vital Signs ED Triage Vitals  Enc Vitals Group     BP 01/13/21 0827 (!) 147/82     Pulse Rate 01/13/21 0827 72     Resp 01/13/21 0827 18     Temp 01/13/21 0827 98.3 F (36.8 C)     Temp Source 01/13/21 0827 Oral      SpO2 01/13/21 0827 98 %     Weight --      Height --      Head Circumference --      Peak Flow --      Pain Score 01/13/21 0826 5     Pain Loc --      Pain Edu? --      Excl. in [place]? --   No data found.  Updated Vital Signs BP 117/73 (BP Location: Left Arm)    Pulse 78    Temp 98 F (36.7 C) (Oral)    Resp 16    SpO2 100%   Physical Exam Vitals and nursing note reviewed.  Constitutional:      General: He is not in acute distress.    Appearance: Normal appearance. He is not ill-appearing.  HENT:     Head: Normocephalic and atraumatic.  Eyes:     General: Lids are normal.        Right eye: No discharge.        Left eye: No discharge.     Extraocular Movements: Extraocular movements intact.     Conjunctiva/sclera: Conjunctivae normal.     Right eye: Right conjunctiva is not injected.     Left eye: Left conjunctiva is not injected.  Neck:     Trachea: Trachea and  phonation normal.  Cardiovascular:     Rate and Rhythm: Normal rate and regular rhythm.     Pulses: Normal pulses.     Heart sounds: Normal heart sounds. No murmur heard.   No friction rub. No gallop.  Pulmonary:     Effort: Pulmonary effort is normal. No accessory muscle usage, prolonged expiration or respiratory distress.     Breath sounds: Normal breath sounds. No stridor, decreased air movement or transmitted upper airway sounds. No decreased breath sounds, wheezing, rhonchi or rales.  Chest:     Chest wall: No tenderness.  Musculoskeletal:        General: Normal range of motion.     Cervical back: Normal range of motion and neck supple. Normal range of motion.  Lymphadenopathy:     Cervical: No cervical adenopathy.  Skin:    General: Skin is warm and dry.     Findings: Lesion (Laceration between left first and second toes with purulent drainage, surrounding erythema, induration and edema, red streaking appreciated on skin going up anterior left leg, there is tenderness to palpation.) present. No erythema or  rash.     Comments: All 7 sutures are deeply embedded below surface of the skin  Neurological:     General: No focal deficit present.     Mental Status: He is alert and oriented to person, place, and time.  Psychiatric:        Mood and Affect: Mood normal.        Behavior: Behavior normal.    Visual Acuity Right Eye Distance:   Left Eye Distance:   Bilateral Distance:    Right Eye Near:   Left Eye Near:    Bilateral Near:     [facility] / Diagnostics / Procedures:    EKG  Radiology No results found.  Procedures Foreign Body Removal  Date/Time: 04/11/2021 12:12 PM Performed by: Lynden Oxford Scales, PA-C Authorized by: Lynden Oxford Scales, PA-C   Consent:    Consent obtained:  Verbal   Consent given by:  Patient   Risks discussed:  Bleeding, infection, pain and incomplete removal   Alternatives discussed:  No treatment, alternative treatment, observation, referral and delayed treatment Universal protocol:    Procedure explained and questions answered to patient or proxy's satisfaction: yes     Patient identity confirmed:  Verbally with patient Location:    Location:  Foot   Foot location:  L top foot   Depth:  Subcutaneous   Tendon involvement:  None Pre-procedure details:    Neurovascular status: intact     Preparation: Patient was prepped and draped in usual sterile fashion   Anesthesia:    Anesthesia method:  Local infiltration   Local anesthetic:  Lidocaine 1% w/o epi Procedure type:    Procedure complexity:  Simple Procedure details:    Dissection of underlying tissues: yes     Removal mechanism:  Forceps   Foreign bodies recovered:  3   Description:  3 embedded sutures   Intact foreign body removal: yes   Post-procedure details:    Neurovascular status: intact     Confirmation:  No additional foreign bodies on visualization   Skin closure:  None   Dressing:  Antibiotic ointment and non-adherent dressing   Procedure completion:  Tolerated  (including critical care time)  UC Diagnoses / Final Clinical Impressions(s)   I have reviewed the triage vital signs and the nursing notes.  Pertinent labs & imaging results that were available during my care of  the patient were reviewed by me and considered in my medical decision making (see chart for details).    Final diagnoses:  Post-traumatic wound infection  Laceration of left foot, sequela  Delayed healing of traumatic wound  Cellulitis of left lower extremity  Encounter for removal of sutures   All sutures were deeply embedded in the skin, patient was unable to tolerate suture removal without local anesthetic which was provided for him, all sutures were removed intact.  Patient was provided with an injection of ceftriaxone in the office to aggressively address the infection in his left foot, patient also provided with a prescription for Bactrim for 7 days.  Patient also advised to soak foot in Epsom salt bath 3 times daily for 20 minutes, redressed wound with bacitracin ointment sterile bandage.  Patient vies to monitor wound for signs of worsening infection, report to the emergency room if these occur.  ED Prescriptions     Medication Sig Dispense Auth. Provider   sulfamethoxazole-trimethoprim (BACTRIM DS) 800-160 MG tablet Take 1 tablet by mouth 2 (two) times daily for 7 days. 14 tablet Lynden Oxford Scales, PA-C      PDMP not reviewed this encounter.  Pending results:  Labs Reviewed - No data to display  Medications Ordered in UC: Medications  cefTRIAXone (ROCEPHIN) injection 1 g (1 g Intramuscular Given 04/11/21 1222)    Disposition Upon Discharge:  Condition: stable for discharge home Home: take medications as prescribed; routine discharge instructions as discussed; follow up as advised.  Patient presented with an acute illness with associated systemic symptoms and significant discomfort requiring urgent management. In my opinion, this is a condition that a prudent  lay person (someone who possesses an average knowledge of health and medicine) may potentially expect to result in complications if not addressed urgently such as respiratory distress, impairment of bodily function or dysfunction of bodily organs.   Routine symptom specific, illness specific and/or disease specific instructions were discussed with the patient and/or caregiver at length.   As such, the patient has been evaluated and assessed, work-up was performed and treatment was provided in alignment with urgent care protocols and evidence based medicine.  Patient/parent/caregiver has been advised that the patient may require follow up for further testing and treatment if the symptoms continue in spite of treatment, as clinically indicated and appropriate.  If the patient was tested for COVID-19, Influenza and/or RSV, then the patient/parent/guardian was advised to isolate at home pending the results of his/her diagnostic coronavirus test and potentially longer if theyre positive. I have also advised pt that if his/her COVID-19 test returns positive, it's recommended to self-isolate for at least 10 days after symptoms first appeared AND until fever-free for 24 hours without fever reducer AND other symptoms have improved or resolved. Discussed self-isolation recommendations as well as instructions for household member/close contacts as per the [facility] and [place] DHHS, and also gave patient the [place] packet with this information.  Patient/parent/caregiver has been advised to return to the [facility] or PCP in 3-5 days if no better; to PCP or the Emergency Department if new signs and symptoms develop, or if the current signs or symptoms continue to change or worsen for further workup, evaluation and treatment as clinically indicated and appropriate  The patient will follow up with their current PCP if and as advised. If the patient does not currently have a PCP we will assist them in obtaining one.   The patient may  need specialty follow up if the symptoms continue,  in spite of conservative treatment and management, for further workup, evaluation, consultation and treatment as clinically indicated and appropriate.   Patient/parent/caregiver verbalized understanding and agreement of plan as discussed.  All questions were addressed during visit.  Please see discharge instructions below for further details of plan.  Discharge Instructions:   Discharge Instructions      You are provided with an injection of ceftriaxone in the office today, this is a strong antibiotic and should quickly eradicate the less concerning bacteria that are complicating your wound and the infection in the wound.  This evening, please be sure you begin taking Bactrim, 1 tablet twice daily and please complete a full 7-day course.  Your sutures were removed today.  Please allow the wound to drain, no further closure is recommended at this time because the wound needs to heal from the inside out.  Please keep the wound covered with a nonstick bandages we recommended.  I recommend that you change the bandages twice daily.  Also recommend that you soak your foot in warm water with Epsom salt 3 times daily for 20 minutes to keep the wound as clean as possible while it is trying to heal.  After soaking your foot, please dress the wound with bacitracin ointment and cover with nonstick bandage.  Please continue to do this until the wound is no longer draining.  After the wound has stopped draining, you can leave it uncovered.  I provided you with a note for work as we discussed.  If you do not believe that the antibiotics are helping, you feel that the redness is spreading, becoming more painful or the drainage has not improved in the next 24 to 48 hours, please go to the emergency room for more aggressive treatment.      This office note has been dictated using Museum/gallery curator.  Unfortunately, and despite my best  efforts, this method of dictation can sometimes lead to occasional typographical or grammatical errors.  I apologize in advance if this occurs.     Lynden Oxford Scales, PA-C 04/11/21 1322

## 2021-04-11 NOTE — Discharge Instructions (Addendum)
You are provided with an injection of ceftriaxone in the office today, this is a strong antibiotic and should quickly eradicate the less concerning bacteria that are complicating your wound and the infection in the wound.  This evening, please be sure you begin taking Bactrim, 1 tablet twice daily and please complete a full 7-day course.  Your sutures were removed today.  Please allow the wound to drain, no further closure is recommended at this time because the wound needs to heal from the inside out.  Please keep the wound covered with a nonstick bandages we recommended.  I recommend that you change the bandages twice daily.  Also recommend that you soak your foot in warm water with Epsom salt 3 times daily for 20 minutes to keep the wound as clean as possible while it is trying to heal.  After soaking your foot, please dress the wound with bacitracin ointment and cover with nonstick bandage.  Please continue to do this until the wound is no longer draining.  After the wound has stopped draining, you can leave it uncovered.  I provided you with a note for work as we discussed.  If you do not believe that the antibiotics are helping, you feel that the redness is spreading, becoming more painful or the drainage has not improved in the next 24 to 48 hours, please go to the emergency room for more aggressive treatment.

## 2021-04-11 NOTE — ED Triage Notes (Signed)
Pt presents for follow up care on laceration to left 2nd toe. This morning he noticed some discoloration to his foot. Pt does have stitches.

## 2021-06-01 ENCOUNTER — Ambulatory Visit (INDEPENDENT_AMBULATORY_CARE_PROVIDER_SITE_OTHER): Payer: BC Managed Care – PPO

## 2021-06-01 ENCOUNTER — Encounter: Payer: Self-pay | Admitting: Podiatry

## 2021-06-01 ENCOUNTER — Other Ambulatory Visit: Payer: Self-pay

## 2021-06-01 ENCOUNTER — Ambulatory Visit (INDEPENDENT_AMBULATORY_CARE_PROVIDER_SITE_OTHER): Payer: Managed Care, Other (non HMO) | Admitting: Podiatry

## 2021-06-01 DIAGNOSIS — M79672 Pain in left foot: Secondary | ICD-10-CM | POA: Diagnosis not present

## 2021-06-01 DIAGNOSIS — S99922A Unspecified injury of left foot, initial encounter: Secondary | ICD-10-CM

## 2021-06-01 DIAGNOSIS — S92502S Displaced unspecified fracture of left lesser toe(s), sequela: Secondary | ICD-10-CM

## 2021-06-01 NOTE — Progress Notes (Signed)
?  Subjective:  ?Patient ID: Richard Walters, male    DOB: [birth year],   MRN: [id number] ? ?No chief complaint on file. ? ? ?51 y.o. male presents for concern of left foot pain. Relates about a month ago he was using a ladder and part of it slammed down on his toes. He was seen in urgent care and X-rays taken and had a laceration that was sewn. States the X-rays were negative at the time.  Denies any other pedal complaints. Denies n/v/f/c.  ? ?No past medical history on file. ? ?Objective:  ?Physical Exam: ?Vascular: DP/PT pulses 2/4 bilateral. CFT <3 seconds. Normal hair growth on digits. No edema.  ?Skin. No lacerations or abrasions bilateral feet.  ?Musculoskeletal: MMT 5/5 bilateral lower extremities in DF, PF, Inversion and Eversion. Deceased ROM in DF of ankle joint. Tender to proximal phalanx of left second toe. Pain with ROM of the joint but improved from prior according to patient.  ?Neurological: Sensation intact to light touch.  ? ?Assessment:  ? ?1. Closed fracture of phalanx of left second toe, sequela   ? ? ? ?Plan:  ?Patient was evaluated and treated and all questions answered. ?X-rays reviewed and discussed with patient. Healing fracture noted to distal aspect of left second proximal phalanx head.  ?-Discussed treatement options for toe fracture; risks, alternatives, and benefits explained. ?-Discussed taping of toe to help with swelling at this point.  ?-At this point may continue in regular shoes.  ?-Recommend protection, rest, ice, elevation daily until symptoms improve ?-Rx pain med/antinflammatories as needed ?-Patient to return to office as needed.  ? ? ? ?Louann Sjogren, DPM  ? ? ?

## 2022-02-11 ENCOUNTER — Other Ambulatory Visit: Payer: Self-pay

## 2022-02-11 ENCOUNTER — Emergency Department (HOSPITAL_BASED_OUTPATIENT_CLINIC_OR_DEPARTMENT_OTHER)
Admission: EM | Admit: 2022-02-11 | Discharge: 2022-02-11 | Disposition: A | Discharge status: Home / Self Care | Payer: BC Managed Care – PPO | Attending: Emergency Medicine | Admitting: Emergency Medicine

## 2022-02-11 ENCOUNTER — Emergency Department (HOSPITAL_BASED_OUTPATIENT_CLINIC_OR_DEPARTMENT_OTHER): Payer: BC Managed Care – PPO

## 2022-02-11 ENCOUNTER — Encounter (HOSPITAL_BASED_OUTPATIENT_CLINIC_OR_DEPARTMENT_OTHER): Payer: Self-pay | Admitting: Emergency Medicine

## 2022-02-11 DIAGNOSIS — R748 Abnormal levels of other serum enzymes: Secondary | ICD-10-CM | POA: Insufficient documentation

## 2022-02-11 DIAGNOSIS — K2900 Acute gastritis without bleeding: Secondary | ICD-10-CM | POA: Insufficient documentation

## 2022-02-11 DIAGNOSIS — R1011 Right upper quadrant pain: Secondary | ICD-10-CM | POA: Diagnosis present

## 2022-02-11 LAB — URINALYSIS, ROUTINE W REFLEX MICROSCOPIC
Bilirubin Urine: NEGATIVE
Glucose, UA: NEGATIVE mg/dL
Hgb urine dipstick: NEGATIVE
Ketones, ur: NEGATIVE mg/dL
Leukocytes,Ua: NEGATIVE
Nitrite: NEGATIVE
Protein, ur: NEGATIVE mg/dL
Specific Gravity, Urine: 1.02 (ref 1.005–1.030)
pH: 7.5 (ref 5.0–8.0)

## 2022-02-11 LAB — COMPREHENSIVE METABOLIC PANEL
ALT: 34 U/L (ref 0–44)
AST: 37 U/L (ref 15–41)
Albumin: 4 g/dL (ref 3.5–5.0)
Alkaline Phosphatase: 49 U/L (ref 38–126)
Anion gap: 6 (ref 5–15)
BUN: 14 mg/dL (ref 6–20)
CO2: 28 mmol/L (ref 22–32)
Calcium: 8.5 mg/dL — ABNORMAL LOW (ref 8.9–10.3)
Chloride: 105 mmol/L (ref 98–111)
Creatinine, Ser: 1.17 mg/dL (ref 0.61–1.24)
GFR, Estimated: >60 mL/min (ref >60)
Glucose, Bld: 93 mg/dL (ref 70–99)
Potassium: 3.6 mmol/L (ref 3.5–5.1)
Sodium: 139 mmol/L (ref 135–145)
Total Bilirubin: 0.8 mg/dL (ref 0.3–1.2)
Total Protein: 7.1 g/dL (ref 6.5–8.1)

## 2022-02-11 LAB — CBC
HCT: 44.7 % (ref 39.0–52.0)
Hemoglobin: 14.7 g/dL (ref 13.0–17.0)
MCH: 29.9 pg (ref 26.0–34.0)
MCHC: 32.9 g/dL (ref 30.0–36.0)
MCV: 90.9 fL (ref 80.0–100.0)
Platelets: 205 10*3/uL (ref 150–400)
RBC: 4.92 MIL/uL (ref 4.22–5.81)
RDW: 13.5 % (ref 11.5–15.5)
WBC: 4.4 10*3/uL (ref 4.0–10.5)
nRBC: 0 % (ref 0.0–0.2)

## 2022-02-11 LAB — LIPASE, BLOOD: Lipase: 55 U/L — ABNORMAL HIGH (ref 11–51)

## 2022-02-11 LAB — TROPONIN I (HIGH SENSITIVITY)
Troponin I (High Sensitivity): 6 ng/L (ref <18)
Troponin I (High Sensitivity): 6 ng/L (ref <18)

## 2022-02-11 MED ORDER — FAMOTIDINE 20 MG PO TABS: 20.0000 mg | ORAL_TABLET | Freq: Two times a day (BID) | ORAL | 0 refills | Status: DC

## 2022-02-11 MED ORDER — ALUM & MAG HYDROXIDE-SIMETH 200-200-20 MG/5ML PO SUSP
30.0000 mL | Freq: Once | ORAL | Status: AC
  Administered 2022-02-11: 30 mL via ORAL
  Filled 2022-02-11: qty 30

## 2022-02-11 MED ORDER — LIDOCAINE VISCOUS HCL 2 % MT SOLN
15.0000 mL | Freq: Once | OROMUCOSAL | Status: AC
  Administered 2022-02-11: 15 mL via ORAL
  Filled 2022-02-11: qty 15

## 2022-02-11 MED ORDER — ACETAMINOPHEN 500 MG PO TABS
1000.0000 mg | ORAL_TABLET | ORAL | Status: AC
  Administered 2022-02-11: 1000 mg via ORAL
  Filled 2022-02-11: qty 2

## 2022-02-11 MED ORDER — SODIUM CHLORIDE 0.9 % IV SOLN: INTRAVENOUS | Status: DC | PRN

## 2022-02-11 MED ORDER — FAMOTIDINE IN NACL 20-0.9 MG/50ML-% IV SOLN
20.0000 mg | Freq: Once | INTRAVENOUS | Status: AC
Start: 2022-02-11 — End: 2022-02-11
  Administered 2022-02-11: 20 mg via INTRAVENOUS
  Filled 2022-02-11: qty 50

## 2022-02-11 NOTE — ED Provider Notes (Signed)
[facility] EMERGENCY DEPARTMENT Provider Note   CSN: [id number] Arrival date & time: 02/11/22  0932     History  Chief Complaint  Patient presents with   Abdominal Pain    Richard Walters is a 51 y.o. male.  51 year old male with history of GERD who presents emergency department with right upper quadrant abdominal pain.  States that it started several days ago with intermittent pain that was substernal and burning.  Says that the pain is since migrated to the right side of his chest.  Says that it lasts for very short period of time and then goes away and now has become sharp and stabbing.  Not pleuritic or exertional.  Not associated with eating.  No diaphoresis or vomiting.  No fevers.  No history of kidney stones, gallstones, DVT or PE or MI.  History reviewed. No pertinent past medical history.     Home Medications Prior to Admission medications   Medication Sig Start Date End Date Taking? Authorizing Provider  famotidine (PEPCID) 20 MG tablet Take 1 tablet (20 mg total) by mouth 2 (two) times daily. 02/11/22  Yes Rondel Baton, MD  Multiple Vitamin (MULTIVITAMIN) tablet Take 1 tablet by mouth daily.    [provider]      Allergies    Patient has no known allergies.    Review of Systems   Review of Systems  Physical Exam Updated Vital Signs BP (!) 124/101 (BP Location: Right Arm)   Pulse (!) 54   Temp 98.4 F (36.9 C) (Oral)   Resp 16   Ht 6\' 1"  (1.854 m)   Wt 82.1 kg   SpO2 100%   BMI 23.88 kg/m  Physical Exam Vitals and nursing note reviewed.  Constitutional:      General: He is not in acute distress.    Appearance: He is well-developed.  HENT:     Head: Normocephalic and atraumatic.     Right Ear: External ear normal.     Left Ear: External ear normal.     Nose: Nose normal.  Eyes:     Extraocular Movements: Extraocular movements intact.     Conjunctiva/sclera: Conjunctivae normal.     Pupils: Pupils are equal, round,  and reactive to light.  Cardiovascular:     Rate and Rhythm: Normal rate and regular rhythm.     Heart sounds: Normal heart sounds.  Pulmonary:     Effort: Pulmonary effort is normal. No respiratory distress.     Breath sounds: Normal breath sounds.  Abdominal:     General: There is no distension.     Palpations: Abdomen is soft. There is no mass.     Tenderness: There is no abdominal tenderness. There is no guarding.     Comments: Negative Murphy sign.  Tenderness to palpation over right inferior ribs  Musculoskeletal:        General: No swelling.     Cervical back: Normal range of motion and neck supple.     Right lower leg: No edema.     Left lower leg: No edema.  Skin:    General: Skin is warm and dry.     Capillary Refill: Capillary refill takes less than 2 seconds.  Neurological:     Mental Status: He is alert. Mental status is at baseline.  Psychiatric:        Mood and Affect: Mood normal.        Behavior: Behavior normal.     ED Results /  Procedures / Treatments   Labs (all labs ordered are listed, but only abnormal results are displayed) Labs Reviewed  LIPASE, BLOOD - Abnormal; Notable for the following components:      Result Value   Lipase 55 (*)    All other components within normal limits  COMPREHENSIVE METABOLIC PANEL - Abnormal; Notable for the following components:   Calcium 8.5 (*)    All other components within normal limits  CBC  URINALYSIS, ROUTINE W REFLEX MICROSCOPIC  TROPONIN I (HIGH SENSITIVITY)  TROPONIN I (HIGH SENSITIVITY)    EKG EKG Interpretation  Date/Time:  Sunday February 11 2022 16:10:96 EST Ventricular Rate:  63 PR Interval:  127 QRS Duration: 76 QT Interval:  394 QTC Calculation: 404 R Axis:   42 Text Interpretation: Sinus rhythm Confirmed by Vonita Moss [phone]) on 02/11/2022 8:35:13 AM  Radiology DG Chest 2 View  Result Date: 02/11/2022 CLINICAL DATA:  Right-sided pain EXAM: CHEST - 2 VIEW COMPARISON:  None  Available. FINDINGS: Heart size and mediastinal contours are unremarkable. No pleural effusion or edema. No airspace opacities identified. Visualized osseous structures are unremarkable. IMPRESSION: No active cardiopulmonary disease. Electronically Signed   By: Signa Kell M.D.   On: 02/11/2022 10:11   US Abdomen Limited RUQ (LIVER/GB)  Result Date: 02/11/2022 CLINICAL DATA:  Right upper quadrant pain EXAM: ULTRASOUND ABDOMEN LIMITED RIGHT UPPER QUADRANT COMPARISON:  None Available. FINDINGS: Gallbladder: No gallstones or wall thickening visualized. No sonographic Murphy sign noted by sonographer. Common bile duct: Diameter: 3.2 mm. Liver: No focal lesion identified. Within normal limits in parenchymal echogenicity. Portal vein is patent on color Doppler imaging with normal direction of blood flow towards the liver. Other: None. IMPRESSION: 1. No acute findings. 2. No gallstones identified. Electronically Signed   By: Signa Kell M.D.   On: 02/11/2022 10:06    Procedures Procedures   Medications Ordered in ED Medications  famotidine (PEPCID) IVPB 20 mg premix (0 mg Intravenous Stopped 02/11/22 1000)  acetaminophen (TYLENOL) tablet 1,000 mg (1,000 mg Oral Given 02/11/22 0921)  alum & mag hydroxide-simeth (MAALOX/MYLANTA) 200-200-20 MG/5ML suspension 30 mL (30 mLs Oral Given 02/11/22 1037)    And  lidocaine (XYLOCAINE) 2 % viscous mouth solution 15 mL (15 mLs Oral Given 02/11/22 1037)    ED Course/ Medical Decision Making/ A&P                           Medical Decision Making Amount and/or Complexity of Data Reviewed Labs: ordered. Radiology: ordered.  Risk OTC drugs. Prescription drug management.   Richard Walters is a 51 y.o. male with comorbidities that complicate the patient evaluation including GERD who presents with chief complaint of epigastric and RUQ pain.   Initial Ddx:  GERD/Gastritis, cholecystitis, MI, PE, PNA, MSK pain  MDM:  Unclear what is causing the  patient's chest pain at this time as it does not clearly fit with any of the above etiologies. His hx of GERD and burning sensation may be reflective of GERD with gastritis or an ulcer since it has become sharp and in his R abdomen. Also could be cholecystitis but has negative murphys and pain is more over rib cage instead of abdominal. MI less likely given lack of risk factors and description of pain. PE considered but pt is PERC negative. May be 2/2 msk pain if his workup here is unremarkable.   Plan:  Labs Troponin EKG CXR RUQ [place] GI cocktail  ED Summary/Re-evaluation:  Patient reassessed and was stable.  Was given a GI cocktail for symptoms and was able to tolerate p.o.  The above work-up did not reveal evidence of MI, pneumonia, cholecystitis.  Of note did have mild elevation of his lipase but would expect this to be higher if this were a pancreatitis flare.  Feel his pain is likely due to either GERD or MSK etiologies.  Was given prescription of Pepcid and instructed to follow-up with his primary doctor in several days regarding his symptoms.  Return precautions discussed prior to discharge.  This patient presents to the ED for concern of complaints listed in HPI, this involves an extensive number of treatment options, and is a complaint that carries with it a high risk of complications and morbidity. Disposition including potential need for admission considered.   Dispo: DC Home. Return precautions discussed including, but not limited to, those listed in the AVS. Allowed pt time to ask questions which were answered fully prior to dc.  Additional history obtained from significant other Records reviewed Outpatient Clinic Notes The following labs were independently interpreted: Chemistry and show no acute abnormality I independently reviewed the following imaging with scope of interpretation limited to determining acute life threatening conditions related to emergency care: Chest x-ray, which  revealed no acute abnormality  I personally reviewed and interpreted cardiac monitoring: normal sinus rhythm  I personally reviewed and interpreted the pt's EKG: see above for interpretation  I have reviewed the patients home medications and made adjustments as needed  Final Clinical Impression(s) / ED Diagnoses Final diagnoses:  Acute gastritis without hemorrhage, unspecified gastritis type    Rx / DC Orders ED Discharge Orders          Ordered    famotidine (PEPCID) 20 MG tablet  2 times daily        02/11/22 1116              Rondel Baton, MD 02/12/22 1415

## 2022-02-11 NOTE — ED Notes (Signed)
ED Provider at bedside. 

## 2022-02-11 NOTE — ED Triage Notes (Signed)
Patient c/o right upper abdominal pain x couple days. Patient denies any other symptoms. Pain radiates into right side of chest. Pain described as constant stabbing sensation. Last BM was yesterday described as hard.

## 2022-02-11 NOTE — Discharge Instructions (Addendum)
You were seen for your pain which is likely from gastritis.   At home, please take the Pepcid we prescribed you as well as as needed over-the-counter antacids for any pain that you may have.    Follow-up with your primary doctor in 2-3 days regarding your visit.    Return immediately to the emergency department if you experience any of the following: Chest pain, shortness of breath, vomiting, fevers, or any other concerning symptoms.    Thank you for visiting our Emergency Department. It was a pleasure taking care of you today.

## 2022-02-11 NOTE — ED Notes (Signed)
Pt transported to CT ?

## 2022-03-16 ENCOUNTER — Ambulatory Visit
Admission: EM | Admit: 2022-03-16 | Discharge: 2022-03-16 | Disposition: A | Discharge status: Home / Self Care | Payer: BC Managed Care – PPO

## 2022-03-16 DIAGNOSIS — B029 Zoster without complications: Secondary | ICD-10-CM

## 2022-03-16 MED ORDER — VALACYCLOVIR HCL 1 G PO TABS
1000.0000 mg | ORAL_TABLET | Freq: Three times a day (TID) | ORAL | 0 refills | Status: AC
Start: 2022-03-16 — End: 2022-03-23

## 2022-03-16 NOTE — Discharge Instructions (Signed)
I have enclosed some information about shingles that I hope you find helpful.  At age 51 and is recommended that you are vaccinated for shingles.    Clinical guidelines recommend that you begin antiviral treatment to suppress the virus and prevent future recurrences.  I have sent prescription for valacyclovir to your pharmacy.  Please take 1 g 3 times daily for 7 full days.  At this time, because you are not having any pain or discomfort, I do not recommend any topical treatment or pain medications.  If your symptoms change, please let us know.  Thank you for visiting urgent care today.

## 2022-03-16 NOTE — ED Provider Notes (Incomplete)
[facility]    CSN: [id number] Arrival date & time: 03/16/22  1700    HISTORY   Chief Complaint  Patient presents with   Blister    Entered by patient   HPI Richard Walters is a pleasant, 51 y.o. male who presents to urgent care today. The pt c/o blisters to right side that he noticed today. There are blisters/ rash to the patients right side, he denies pain and itching to the area.  Patient states he had chickenpox as a child.  Patient states he has not tried any treatment of the rash.  The history is provided by the patient.   History reviewed. No pertinent past medical history. There are no problems to display for this patient.  Past Surgical History:  Procedure Laterality Date   STERNUM FRACTURE SURGERY      Home Medications    Prior to Admission medications   Medication Sig Start Date End Date Taking? Authorizing Provider  pantoprazole (PROTONIX) 20 MG tablet Take by mouth. 02/26/22 05/27/22 Yes [provider]  valACYclovir (VALTREX) 1000 MG tablet Take 1 tablet (1,000 mg total) by mouth 3 (three) times daily for 7 days. 03/16/22 03/23/22 Yes Lynden Oxford Scales, PA-C  famotidine (PEPCID) 20 MG tablet Take 1 tablet (20 mg total) by mouth 2 (two) times daily. 02/11/22   Fransico Meadow, MD  Multiple Vitamin (MULTIVITAMIN) tablet Take 1 tablet by mouth daily.    [provider]    Family History Family History  Problem Relation Age of Onset   Hypertension Father    Anemia Sister    Diabetes Paternal Grandmother    Hyperlipidemia Paternal Grandmother    Social History Social History   Tobacco Use   Smoking status: Never   Smokeless tobacco: Never  Vaping Use   Vaping Use: Never used  Substance Use Topics   Alcohol use: Yes    Alcohol/week: 4.0 standard drinks of alcohol    Types: 4 Standard drinks or equivalent per week    Comment: BEER and WINE occ   Drug use: No   Allergies   Patient has no known  allergies.  Review of Systems Review of Systems Pertinent findings revealed after performing a 14 point review of systems has been noted in the history of present illness.  Physical Exam Triage Vital Signs ED Triage Vitals  Enc Vitals Group     BP 01/13/21 0827 (!) 147/82     Pulse Rate 01/13/21 0827 72     Resp 01/13/21 0827 18     Temp 01/13/21 0827 98.3 F (36.8 C)     Temp Source 01/13/21 0827 Oral     SpO2 01/13/21 0827 98 %     Weight --      Height --      Head Circumference --      Peak Flow --      Pain Score 01/13/21 0826 5     Pain Loc --      Pain Edu? --      Excl. in [employer]? --   No data found.  Updated Vital Signs BP 127/80 (BP Location: Right Arm)   Pulse 62   Temp 98.9 F (37.2 C) (Oral)   Resp 17   SpO2 97%   Physical Exam Vitals and nursing note reviewed.  Constitutional:      General: He is not in acute distress.    Appearance: Normal appearance. He is normal weight. He is not ill-appearing.  HENT:     Head: Normocephalic and atraumatic.  Eyes:     Extraocular Movements: Extraocular movements intact.     Conjunctiva/sclera: Conjunctivae normal.     Pupils: Pupils are equal, round, and reactive to light.  Cardiovascular:     Rate and Rhythm: Normal rate and regular rhythm.  Pulmonary:     Effort: Pulmonary effort is normal.     Breath sounds: Normal breath sounds.  Musculoskeletal:        General: Normal range of motion.     Cervical back: Normal range of motion and neck supple.  Skin:    General: Skin is warm and dry.     Findings: Rash (See photos below) present.  Neurological:     General: No focal deficit present.     Mental Status: He is alert and oriented to person, place, and time. Mental status is at baseline.  Psychiatric:        Mood and Affect: Mood normal.        Behavior: Behavior normal.        Thought Content: Thought content normal.        Judgment: Judgment normal.        Visual Acuity Right Eye Distance:   Left  Eye Distance:   Bilateral Distance:    Right Eye Near:   Left Eye Near:    Bilateral Near:     UC Couse / Diagnostics / Procedures:     Radiology No results found.  Procedures Procedures (including critical care time) EKG  Pending results:  Labs Reviewed - No data to display  Medications Ordered in UC: Medications - No data to display  UC Diagnoses / Final Clinical Impressions(s)   I have reviewed the triage vital signs and the nursing notes.  Pertinent labs & imaging results that were available during my care of the patient were reviewed by me and considered in my medical decision making (see chart for details).    Final diagnoses:  Herpes zoster without complication   ***  Please see discharge instructions below for details of plan of care as provided to patient. ED Prescriptions     Medication Sig Dispense Auth. Provider   valACYclovir (VALTREX) 1000 MG tablet Take 1 tablet (1,000 mg total) by mouth 3 (three) times daily for 7 days. 21 tablet Lynden Oxford Scales, PA-C      PDMP not reviewed this encounter.  Pending results:  Labs Reviewed - No data to display  Discharge Instructions:   Discharge Instructions      I have enclosed some information about shingles that I hope you find helpful.  At age 67 and is recommended that you are vaccinated for shingles.    Clinical guidelines recommend that you begin antiviral treatment to suppress the virus and prevent future recurrences.  I have sent prescription for valacyclovir to your pharmacy.  Please take 1 g 3 times daily for 7 full days.  At this time, because you are not having any pain or discomfort, I do not recommend any topical treatment or pain medications.  If your symptoms change, please let us know.  Thank you for visiting urgent care today.    Disposition Upon Discharge:  Condition: stable for discharge home  Patient presented with an acute illness with associated systemic symptoms and  significant discomfort requiring urgent management. In my opinion, this is a condition that a prudent lay person (someone who possesses an average knowledge of health and medicine) may potentially expect to  result in complications if not addressed urgently such as respiratory distress, impairment of bodily function or dysfunction of bodily organs.   Routine symptom specific, illness specific and/or disease specific instructions were discussed with the patient and/or caregiver at length.   As such, the patient has been evaluated and assessed, work-up was performed and treatment was provided in alignment with urgent care protocols and evidence based medicine.  Patient/parent/caregiver has been advised that the patient may require follow up for further testing and treatment if the symptoms continue in spite of treatment, as clinically indicated and appropriate.  Patient/parent/caregiver has been advised to return to the [facility] or PCP if no better; to PCP or the Emergency Department if new signs and symptoms develop, or if the current signs or symptoms continue to change or worsen for further workup, evaluation and treatment as clinically indicated and appropriate  The patient will follow up with their current PCP if and as advised. If the patient does not currently have a PCP we will assist them in obtaining one.   The patient may need specialty follow up if the symptoms continue, in spite of conservative treatment and management, for further workup, evaluation, consultation and treatment as clinically indicated and appropriate.  Patient/parent/caregiver verbalized understanding and agreement of plan as discussed.  All questions were addressed during visit.  Please see discharge instructions below for further details of plan.  This office note has been dictated using Teaching laboratory technician.  Unfortunately, this method of dictation can sometimes lead to typographical or grammatical errors.  I  apologize for your inconvenience in advance if this occurs.  Please do not hesitate to reach out to me if clarification is needed.

## 2022-03-16 NOTE — ED Triage Notes (Signed)
The pt c/o blisters to right side that he noticed today. There are blisters/ rash to the patients right side, he denies pain and itching to the area. Home interventions: none

## 2023-06-15 ENCOUNTER — Ambulatory Visit (HOSPITAL_BASED_OUTPATIENT_CLINIC_OR_DEPARTMENT_OTHER): Payer: Self-pay | Admitting: Orthopaedic Surgery

## 2023-06-15 ENCOUNTER — Other Ambulatory Visit: Payer: Self-pay

## 2023-06-15 ENCOUNTER — Emergency Department (HOSPITAL_COMMUNITY)

## 2023-06-15 ENCOUNTER — Encounter (HOSPITAL_COMMUNITY): Payer: Self-pay

## 2023-06-15 ENCOUNTER — Inpatient Hospital Stay (HOSPITAL_COMMUNITY)
Admission: EM | Admit: 2023-06-15 | Discharge: 2023-06-19 | DRG: 464 | Disposition: A | Discharge status: Home Health | Attending: Orthopaedic Surgery | Admitting: Orthopaedic Surgery

## 2023-06-15 DIAGNOSIS — Z8249 Family history of ischemic heart disease and other diseases of the circulatory system: Secondary | ICD-10-CM

## 2023-06-15 DIAGNOSIS — Y92410 Unspecified street and highway as the place of occurrence of the external cause: Secondary | ICD-10-CM

## 2023-06-15 DIAGNOSIS — S82891A Other fracture of right lower leg, initial encounter for closed fracture: Secondary | ICD-10-CM

## 2023-06-15 DIAGNOSIS — Z832 Family history of diseases of the blood and blood-forming organs and certain disorders involving the immune mechanism: Secondary | ICD-10-CM

## 2023-06-15 DIAGNOSIS — Z79899 Other long term (current) drug therapy: Secondary | ICD-10-CM | POA: Diagnosis not present

## 2023-06-15 DIAGNOSIS — Z83438 Family history of other disorder of lipoprotein metabolism and other lipidemia: Secondary | ICD-10-CM | POA: Diagnosis not present

## 2023-06-15 DIAGNOSIS — S72041A Displaced fracture of base of neck of right femur, initial encounter for closed fracture: Secondary | ICD-10-CM | POA: Diagnosis not present

## 2023-06-15 DIAGNOSIS — S81811A Laceration without foreign body, right lower leg, initial encounter: Secondary | ICD-10-CM | POA: Diagnosis present

## 2023-06-15 DIAGNOSIS — F439 Reaction to severe stress, unspecified: Secondary | ICD-10-CM | POA: Diagnosis not present

## 2023-06-15 DIAGNOSIS — S72001A Fracture of unspecified part of neck of right femur, initial encounter for closed fracture: Secondary | ICD-10-CM | POA: Diagnosis present

## 2023-06-15 DIAGNOSIS — Z833 Family history of diabetes mellitus: Secondary | ICD-10-CM | POA: Diagnosis not present

## 2023-06-15 DIAGNOSIS — D62 Acute posthemorrhagic anemia: Secondary | ICD-10-CM | POA: Diagnosis present

## 2023-06-15 DIAGNOSIS — Z7982 Long term (current) use of aspirin: Secondary | ICD-10-CM | POA: Diagnosis not present

## 2023-06-15 DIAGNOSIS — S8251XA Displaced fracture of medial malleolus of right tibia, initial encounter for closed fracture: Secondary | ICD-10-CM | POA: Diagnosis present

## 2023-06-15 DIAGNOSIS — Z8781 Personal history of (healed) traumatic fracture: Secondary | ICD-10-CM

## 2023-06-15 DIAGNOSIS — Z23 Encounter for immunization: Secondary | ICD-10-CM

## 2023-06-15 DIAGNOSIS — S81801A Unspecified open wound, right lower leg, initial encounter: Secondary | ICD-10-CM | POA: Diagnosis not present

## 2023-06-15 LAB — I-STAT CG4 LACTIC ACID, ED: Lactic Acid, Venous: 2.3 mmol/L (ref 0.5–1.9)

## 2023-06-15 LAB — CBC WITH DIFFERENTIAL/PLATELET
Abs Immature Granulocytes: 0.04 10*3/uL (ref 0.00–0.07)
Basophils Absolute: 0 10*3/uL (ref 0.0–0.1)
Basophils Relative: 1 %
Eosinophils Absolute: 0.1 10*3/uL (ref 0.0–0.5)
Eosinophils Relative: 1 %
HCT: 42.6 % (ref 39.0–52.0)
Hemoglobin: 13.9 g/dL (ref 13.0–17.0)
Immature Granulocytes: 1 %
Lymphocytes Relative: 20 %
Lymphs Abs: 1 10*3/uL (ref 0.7–4.0)
MCH: 29.8 pg (ref 26.0–34.0)
MCHC: 32.6 g/dL (ref 30.0–36.0)
MCV: 91.4 fL (ref 80.0–100.0)
Monocytes Absolute: 0.5 10*3/uL (ref 0.1–1.0)
Monocytes Relative: 11 %
Neutro Abs: 3.2 10*3/uL (ref 1.7–7.7)
Neutrophils Relative %: 66 %
Platelets: 190 10*3/uL (ref 150–400)
RBC: 4.66 MIL/uL (ref 4.22–5.81)
RDW: 13.4 % (ref 11.5–15.5)
WBC: 4.8 10*3/uL (ref 4.0–10.5)
nRBC: 0 % (ref 0.0–0.2)

## 2023-06-15 LAB — ETHANOL: Alcohol, Ethyl (B): <10 mg/dL (ref <10)

## 2023-06-15 LAB — COMPREHENSIVE METABOLIC PANEL WITH GFR
ALT: 28 U/L (ref 0–44)
AST: 29 U/L (ref 15–41)
Albumin: 3.6 g/dL (ref 3.5–5.0)
Alkaline Phosphatase: 38 U/L (ref 38–126)
Anion gap: 9 (ref 5–15)
BUN: 12 mg/dL (ref 6–20)
CO2: 22 mmol/L (ref 22–32)
Calcium: 8.8 mg/dL — ABNORMAL LOW (ref 8.9–10.3)
Chloride: 106 mmol/L (ref 98–111)
Creatinine, Ser: 1.2 mg/dL (ref 0.61–1.24)
GFR, Estimated: >60 mL/min (ref >60)
Glucose, Bld: 128 mg/dL — ABNORMAL HIGH (ref 70–99)
Potassium: 3.6 mmol/L (ref 3.5–5.1)
Sodium: 137 mmol/L (ref 135–145)
Total Bilirubin: 0.8 mg/dL (ref 0.0–1.2)
Total Protein: 6.3 g/dL — ABNORMAL LOW (ref 6.5–8.1)

## 2023-06-15 LAB — I-STAT CHEM 8, ED
BUN: 14 mg/dL (ref 6–20)
Calcium, Ion: 1.14 mmol/L — ABNORMAL LOW (ref 1.15–1.40)
Chloride: 104 mmol/L (ref 98–111)
Creatinine, Ser: 1.3 mg/dL — ABNORMAL HIGH (ref 0.61–1.24)
Glucose, Bld: 124 mg/dL — ABNORMAL HIGH (ref 70–99)
HCT: 43 % (ref 39.0–52.0)
Hemoglobin: 14.6 g/dL (ref 13.0–17.0)
Potassium: 3.8 mmol/L (ref 3.5–5.1)
Sodium: 139 mmol/L (ref 135–145)
TCO2: 25 mmol/L (ref 22–32)

## 2023-06-15 LAB — CBC
HCT: 40.5 % (ref 39.0–52.0)
Hemoglobin: 13.6 g/dL (ref 13.0–17.0)
MCH: 30.5 pg (ref 26.0–34.0)
MCHC: 33.6 g/dL (ref 30.0–36.0)
MCV: 90.8 fL (ref 80.0–100.0)
Platelets: 182 10*3/uL (ref 150–400)
RBC: 4.46 MIL/uL (ref 4.22–5.81)
RDW: 13.4 % (ref 11.5–15.5)
WBC: 10.1 10*3/uL (ref 4.0–10.5)
nRBC: 0 % (ref 0.0–0.2)

## 2023-06-15 LAB — PROTIME-INR
INR: 1.1 (ref 0.8–1.2)
Prothrombin Time: 14.1 s (ref 11.4–15.2)

## 2023-06-15 LAB — TYPE AND SCREEN
ABO/RH(D): B POS
Antibody Screen: NEGATIVE

## 2023-06-15 LAB — APTT: aPTT: 29 s (ref 24–36)

## 2023-06-15 LAB — ABO/RH: ABO/RH(D): B POS

## 2023-06-15 MED ORDER — FENTANYL CITRATE PF 50 MCG/ML IJ SOSY
PREFILLED_SYRINGE | INTRAMUSCULAR | Status: AC
  Filled 2023-06-15: qty 1

## 2023-06-15 MED ORDER — ONDANSETRON HCL 4 MG PO TABS
4.0000 mg | ORAL_TABLET | Freq: Four times a day (QID) | ORAL | Status: DC | PRN
Start: 2023-06-15 — End: 2023-06-19

## 2023-06-15 MED ORDER — LIDOCAINE-EPINEPHRINE (PF) 2 %-1:200000 IJ SOLN
10.0000 mL | Freq: Once | INTRAMUSCULAR | Status: AC
  Administered 2023-06-15: 10 mL
  Filled 2023-06-15: qty 20

## 2023-06-15 MED ORDER — SODIUM CHLORIDE 0.9 % IV BOLUS
1000.0000 mL | Freq: Once | INTRAVENOUS | Status: AC
  Administered 2023-06-15: 1000 mL via INTRAVENOUS

## 2023-06-15 MED ORDER — IOHEXOL 350 MG/ML SOLN
75.0000 mL | Freq: Once | INTRAVENOUS | Status: AC | PRN
  Administered 2023-06-15: 75 mL via INTRAVENOUS

## 2023-06-15 MED ORDER — DOCUSATE SODIUM 100 MG PO CAPS
100.0000 mg | ORAL_CAPSULE | Freq: Two times a day (BID) | ORAL | Status: DC
Start: 2023-06-15 — End: 2023-06-16
  Administered 2023-06-15: 100 mg via ORAL
  Filled 2023-06-15: qty 1

## 2023-06-15 MED ORDER — OXYCODONE HCL 5 MG PO TABS: 10.0000 mg | ORAL_TABLET | ORAL | Status: DC | PRN

## 2023-06-15 MED ORDER — SODIUM CHLORIDE 0.9% FLUSH
3.0000 mL | Freq: Two times a day (BID) | INTRAVENOUS | Status: DC
  Administered 2023-06-15 – 2023-06-16 (×3): 3 mL via INTRAVENOUS
  Administered 2023-06-17 – 2023-06-18 (×3): 10 mL via INTRAVENOUS
  Administered 2023-06-18: 3 mL via INTRAVENOUS
  Administered 2023-06-19: 10 mL via INTRAVENOUS

## 2023-06-15 MED ORDER — OXYCODONE HCL 5 MG PO TABS
5.0000 mg | ORAL_TABLET | ORAL | Status: DC | PRN
  Administered 2023-06-15: 5 mg via ORAL
  Administered 2023-06-15 – 2023-06-16 (×2): 10 mg via ORAL
  Administered 2023-06-17 (×2): 5 mg via ORAL
  Administered 2023-06-18 – 2023-06-19 (×3): 10 mg via ORAL
  Filled 2023-06-15 (×3): qty 2
  Filled 2023-06-15: qty 1
  Filled 2023-06-15 (×2): qty 2
  Filled 2023-06-15 (×2): qty 1

## 2023-06-15 MED ORDER — ACETAMINOPHEN 325 MG PO TABS: 325.0000 mg | ORAL_TABLET | Freq: Four times a day (QID) | ORAL | Status: DC | PRN

## 2023-06-15 MED ORDER — FENTANYL CITRATE PF 50 MCG/ML IJ SOSY
50.0000 ug | PREFILLED_SYRINGE | Freq: Once | INTRAMUSCULAR | Status: AC
  Administered 2023-06-15: 50 ug via INTRAVENOUS

## 2023-06-15 MED ORDER — ONDANSETRON HCL 4 MG/2ML IJ SOLN: 4.0000 mg | Freq: Four times a day (QID) | INTRAMUSCULAR | Status: DC | PRN

## 2023-06-15 MED ORDER — HYDROMORPHONE HCL 1 MG/ML IJ SOLN
0.5000 mg | INTRAMUSCULAR | Status: DC | PRN
  Administered 2023-06-15 – 2023-06-16 (×3): 1 mg via INTRAVENOUS
  Filled 2023-06-15 (×4): qty 1

## 2023-06-15 MED ORDER — SODIUM CHLORIDE 0.9% FLUSH: 3.0000 mL | INTRAVENOUS | Status: DC | PRN

## 2023-06-15 MED ORDER — TETANUS-DIPHTH-ACELL PERTUSSIS 5-2.5-18.5 LF-MCG/0.5 IM SUSY
0.5000 mL | PREFILLED_SYRINGE | Freq: Once | INTRAMUSCULAR | Status: AC
  Administered 2023-06-15: 0.5 mL via INTRAMUSCULAR

## 2023-06-15 MED ORDER — ACETAMINOPHEN 500 MG PO TABS
1000.0000 mg | ORAL_TABLET | Freq: Four times a day (QID) | ORAL | Status: DC
Start: 2023-06-15 — End: 2023-06-16
  Administered 2023-06-15 (×2): 1000 mg via ORAL
  Filled 2023-06-15 (×2): qty 2

## 2023-06-15 MED ORDER — CEFAZOLIN SODIUM-DEXTROSE 2-4 GM/100ML-% IV SOLN
2.0000 g | Freq: Once | INTRAVENOUS | Status: AC
  Administered 2023-06-15: 2 g via INTRAVENOUS

## 2023-06-15 MED ORDER — MORPHINE SULFATE (PF) 4 MG/ML IV SOLN
4.0000 mg | Freq: Once | INTRAVENOUS | Status: AC
  Administered 2023-06-15: 4 mg via INTRAVENOUS
  Filled 2023-06-15: qty 1

## 2023-06-15 NOTE — ED Triage Notes (Signed)
Back to room from CT  

## 2023-06-15 NOTE — ED Notes (Signed)
 X-ray at bedside

## 2023-06-15 NOTE — ED Notes (Signed)
Pt to CT via stretcher w RN
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
## 2023-06-16 ENCOUNTER — Inpatient Hospital Stay (HOSPITAL_COMMUNITY)

## 2023-06-16 ENCOUNTER — Inpatient Hospital Stay (HOSPITAL_COMMUNITY): Admitting: Anesthesiology

## 2023-06-16 ENCOUNTER — Encounter (HOSPITAL_COMMUNITY)
Admission: EM | Disposition: A | Discharge status: Home Health | Payer: Self-pay | Source: Home / Self Care | Attending: Orthopaedic Surgery

## 2023-06-16 ENCOUNTER — Encounter (HOSPITAL_COMMUNITY): Payer: Self-pay | Admitting: Orthopaedic Surgery

## 2023-06-16 ENCOUNTER — Other Ambulatory Visit: Payer: Self-pay

## 2023-06-16 DIAGNOSIS — S81801A Unspecified open wound, right lower leg, initial encounter: Secondary | ICD-10-CM

## 2023-06-16 DIAGNOSIS — S8251XA Displaced fracture of medial malleolus of right tibia, initial encounter for closed fracture: Secondary | ICD-10-CM

## 2023-06-16 DIAGNOSIS — S82891A Other fracture of right lower leg, initial encounter for closed fracture: Secondary | ICD-10-CM

## 2023-06-16 DIAGNOSIS — S72041A Displaced fracture of base of neck of right femur, initial encounter for closed fracture: Secondary | ICD-10-CM | POA: Diagnosis not present

## 2023-06-16 HISTORY — PX: ORIF ANKLE FRACTURE: SHX5408

## 2023-06-16 HISTORY — PX: TOTAL HIP ARTHROPLASTY: SHX124

## 2023-06-16 LAB — BASIC METABOLIC PANEL WITH GFR
Anion gap: 7 (ref 5–15)
BUN: 10 mg/dL (ref 6–20)
CO2: 23 mmol/L (ref 22–32)
Calcium: 8.3 mg/dL — ABNORMAL LOW (ref 8.9–10.3)
Chloride: 105 mmol/L (ref 98–111)
Creatinine, Ser: 1.05 mg/dL (ref 0.61–1.24)
GFR, Estimated: >60 mL/min (ref >60)
Glucose, Bld: 106 mg/dL — ABNORMAL HIGH (ref 70–99)
Potassium: 3.6 mmol/L (ref 3.5–5.1)
Sodium: 135 mmol/L (ref 135–145)

## 2023-06-16 LAB — SURGICAL PCR SCREEN
MRSA, PCR: NEGATIVE
Staphylococcus aureus: NEGATIVE

## 2023-06-16 SURGERY — ARTHROPLASTY, HIP, TOTAL, ANTERIOR APPROACH
Anesthesia: General | Site: Hip | Laterality: Right

## 2023-06-16 MED ORDER — MIDAZOLAM HCL 2 MG/2ML IJ SOLN
INTRAMUSCULAR | Status: DC | PRN
  Administered 2023-06-16: 2 mg via INTRAVENOUS

## 2023-06-16 MED ORDER — ROCURONIUM BROMIDE 10 MG/ML (PF) SYRINGE
PREFILLED_SYRINGE | INTRAVENOUS | Status: DC | PRN
  Administered 2023-06-16: 50 mg via INTRAVENOUS

## 2023-06-16 MED ORDER — OXYCODONE HCL 5 MG/5ML PO SOLN: 5.0000 mg | Freq: Once | ORAL | Status: DC | PRN

## 2023-06-16 MED ORDER — ACETAMINOPHEN 500 MG PO TABS: 1000.0000 mg | ORAL_TABLET | Freq: Once | ORAL | Status: AC

## 2023-06-16 MED ORDER — ORAL CARE MOUTH RINSE: 15.0000 mL | Freq: Once | OROMUCOSAL | Status: AC

## 2023-06-16 MED ORDER — OXYCODONE HCL 5 MG PO TABS: 5.0000 mg | ORAL_TABLET | Freq: Once | ORAL | Status: DC | PRN

## 2023-06-16 MED ORDER — KETOROLAC TROMETHAMINE 15 MG/ML IJ SOLN
INTRAMUSCULAR | Status: DC | PRN
Start: 2023-06-16 — End: 2023-06-16
  Administered 2023-06-16: 15 mg via INTRAVENOUS

## 2023-06-16 MED ORDER — CHLORHEXIDINE GLUCONATE 0.12 % MT SOLN: 15.0000 mL | Freq: Once | OROMUCOSAL | Status: AC

## 2023-06-16 MED ORDER — HYDROMORPHONE HCL 1 MG/ML IJ SOLN
INTRAMUSCULAR | Status: DC | PRN
  Administered 2023-06-16: .5 mg via INTRAVENOUS

## 2023-06-16 MED ORDER — METHOCARBAMOL 1000 MG/10ML IJ SOLN: 500.0000 mg | Freq: Four times a day (QID) | INTRAMUSCULAR | Status: DC | PRN

## 2023-06-16 MED ORDER — ONDANSETRON HCL 4 MG/2ML IJ SOLN
INTRAMUSCULAR | Status: AC
  Filled 2023-06-16: qty 2

## 2023-06-16 MED ORDER — KETAMINE HCL 10 MG/ML IJ SOLN
INTRAMUSCULAR | Status: DC | PRN
  Administered 2023-06-16: 20 mg via INTRAVENOUS
  Administered 2023-06-16: 30 mg via INTRAVENOUS

## 2023-06-16 MED ORDER — FENTANYL CITRATE (PF) 250 MCG/5ML IJ SOLN
INTRAMUSCULAR | Status: AC
  Filled 2023-06-16: qty 5

## 2023-06-16 MED ORDER — ASPIRIN 81 MG PO CHEW
81.0000 mg | CHEWABLE_TABLET | Freq: Two times a day (BID) | ORAL | Status: DC
  Administered 2023-06-16: 81 mg via ORAL
  Filled 2023-06-16: qty 1

## 2023-06-16 MED ORDER — CELECOXIB 200 MG PO CAPS
ORAL_CAPSULE | ORAL | Status: AC
  Administered 2023-06-16: 200 mg via ORAL
  Filled 2023-06-16: qty 1

## 2023-06-16 MED ORDER — PHENYLEPHRINE 80 MCG/ML (10ML) SYRINGE FOR IV PUSH (FOR BLOOD PRESSURE SUPPORT)
PREFILLED_SYRINGE | INTRAVENOUS | Status: DC | PRN
  Administered 2023-06-16: 80 ug via INTRAVENOUS

## 2023-06-16 MED ORDER — PANTOPRAZOLE SODIUM 40 MG PO TBEC
40.0000 mg | DELAYED_RELEASE_TABLET | Freq: Every day | ORAL | Status: DC
  Administered 2023-06-16 – 2023-06-19 (×4): 40 mg via ORAL
  Filled 2023-06-16 (×4): qty 1

## 2023-06-16 MED ORDER — FENTANYL CITRATE (PF) 100 MCG/2ML IJ SOLN
25.0000 ug | INTRAMUSCULAR | Status: DC | PRN
  Administered 2023-06-16: 50 ug via INTRAVENOUS

## 2023-06-16 MED ORDER — TRANEXAMIC ACID-NACL 1000-0.7 MG/100ML-% IV SOLN
INTRAVENOUS | Status: AC
  Filled 2023-06-16: qty 100

## 2023-06-16 MED ORDER — LIDOCAINE 2% (20 MG/ML) 5 ML SYRINGE
INTRAMUSCULAR | Status: AC
  Filled 2023-06-16: qty 5

## 2023-06-16 MED ORDER — MENTHOL 3 MG MT LOZG: 1.0000 | LOZENGE | OROMUCOSAL | Status: DC | PRN

## 2023-06-16 MED ORDER — SODIUM CHLORIDE 0.9 % IR SOLN
Status: DC | PRN
  Administered 2023-06-16: 3000 mL

## 2023-06-16 MED ORDER — SODIUM CHLORIDE 0.9 % IV SOLN: 12.5000 mg | INTRAVENOUS | Status: DC | PRN

## 2023-06-16 MED ORDER — CEFAZOLIN SODIUM-DEXTROSE 2-4 GM/100ML-% IV SOLN
2.0000 g | Freq: Four times a day (QID) | INTRAVENOUS | Status: AC
  Administered 2023-06-16 – 2023-06-17 (×2): 2 g via INTRAVENOUS
  Filled 2023-06-16 (×2): qty 100

## 2023-06-16 MED ORDER — AMISULPRIDE (ANTIEMETIC) 5 MG/2ML IV SOLN: 10.0000 mg | Freq: Once | INTRAVENOUS | Status: DC | PRN

## 2023-06-16 MED ORDER — 0.9 % SODIUM CHLORIDE (POUR BTL) OPTIME
TOPICAL | Status: DC | PRN
  Administered 2023-06-16: 1000 mL

## 2023-06-16 MED ORDER — METHOCARBAMOL 500 MG PO TABS
500.0000 mg | ORAL_TABLET | Freq: Four times a day (QID) | ORAL | Status: DC | PRN
  Administered 2023-06-16 – 2023-06-19 (×6): 500 mg via ORAL
  Filled 2023-06-16 (×6): qty 1

## 2023-06-16 MED ORDER — PROPOFOL 10 MG/ML IV BOLUS
INTRAVENOUS | Status: DC | PRN
  Administered 2023-06-16: 200 mg via INTRAVENOUS

## 2023-06-16 MED ORDER — DEXAMETHASONE SODIUM PHOSPHATE 10 MG/ML IJ SOLN
INTRAMUSCULAR | Status: DC | PRN
  Administered 2023-06-16: 10 mg via INTRAVENOUS

## 2023-06-16 MED ORDER — CEFAZOLIN SODIUM-DEXTROSE 2-4 GM/100ML-% IV SOLN
2.0000 g | INTRAVENOUS | Status: AC
  Administered 2023-06-16: 2 g via INTRAVENOUS

## 2023-06-16 MED ORDER — DIPHENHYDRAMINE HCL 12.5 MG/5ML PO ELIX: 12.5000 mg | ORAL_SOLUTION | ORAL | Status: DC | PRN

## 2023-06-16 MED ORDER — ALUM & MAG HYDROXIDE-SIMETH 200-200-20 MG/5ML PO SUSP: 30.0000 mL | ORAL | Status: DC | PRN

## 2023-06-16 MED ORDER — GABAPENTIN 300 MG PO CAPS: 300.0000 mg | ORAL_CAPSULE | Freq: Once | ORAL | Status: AC

## 2023-06-16 MED ORDER — CEFAZOLIN SODIUM-DEXTROSE 2-4 GM/100ML-% IV SOLN
INTRAVENOUS | Status: AC
  Filled 2023-06-16: qty 100

## 2023-06-16 MED ORDER — PROPOFOL 10 MG/ML IV BOLUS
INTRAVENOUS | Status: AC
  Filled 2023-06-16: qty 20

## 2023-06-16 MED ORDER — MIDAZOLAM HCL 2 MG/2ML IJ SOLN
INTRAMUSCULAR | Status: AC
  Filled 2023-06-16: qty 2

## 2023-06-16 MED ORDER — TRANEXAMIC ACID-NACL 1000-0.7 MG/100ML-% IV SOLN
1000.0000 mg | INTRAVENOUS | Status: DC
Start: 2023-06-16 — End: 2023-06-16

## 2023-06-16 MED ORDER — CELECOXIB 200 MG PO CAPS: 200.0000 mg | ORAL_CAPSULE | Freq: Once | ORAL | Status: AC

## 2023-06-16 MED ORDER — PHENOL 1.4 % MT LIQD: 1.0000 | OROMUCOSAL | Status: DC | PRN

## 2023-06-16 MED ORDER — LIDOCAINE 2% (20 MG/ML) 5 ML SYRINGE
INTRAMUSCULAR | Status: DC | PRN
  Administered 2023-06-16: 60 mg via INTRAVENOUS

## 2023-06-16 MED ORDER — SODIUM CHLORIDE 0.9 % IV SOLN: INTRAVENOUS | Status: DC

## 2023-06-16 MED ORDER — HYDROMORPHONE HCL 1 MG/ML IJ SOLN
INTRAMUSCULAR | Status: AC
Start: 2023-06-16 — End: ?
  Filled 2023-06-16: qty 0.5

## 2023-06-16 MED ORDER — KETAMINE HCL 50 MG/5ML IJ SOSY
PREFILLED_SYRINGE | INTRAMUSCULAR | Status: AC
  Filled 2023-06-16: qty 5

## 2023-06-16 MED ORDER — DEXAMETHASONE SODIUM PHOSPHATE 10 MG/ML IJ SOLN
INTRAMUSCULAR | Status: AC
  Filled 2023-06-16: qty 1

## 2023-06-16 MED ORDER — CHLORHEXIDINE GLUCONATE 0.12 % MT SOLN
OROMUCOSAL | Status: AC
  Administered 2023-06-16: 15 mL via OROMUCOSAL
  Filled 2023-06-16: qty 15

## 2023-06-16 MED ORDER — FENTANYL CITRATE (PF) 250 MCG/5ML IJ SOLN
INTRAMUSCULAR | Status: DC | PRN
Start: 2023-06-16 — End: 2023-06-16
  Administered 2023-06-16: 50 ug via INTRAVENOUS
  Administered 2023-06-16: 100 ug via INTRAVENOUS
  Administered 2023-06-16 (×2): 50 ug via INTRAVENOUS

## 2023-06-16 MED ORDER — ONDANSETRON HCL 4 MG/2ML IJ SOLN
INTRAMUSCULAR | Status: DC | PRN
  Administered 2023-06-16: 4 mg via INTRAVENOUS

## 2023-06-16 MED ORDER — FENTANYL CITRATE (PF) 100 MCG/2ML IJ SOLN
INTRAMUSCULAR | Status: AC
  Filled 2023-06-16: qty 2

## 2023-06-16 MED ORDER — SUGAMMADEX SODIUM 200 MG/2ML IV SOLN
INTRAVENOUS | Status: DC | PRN
  Administered 2023-06-16: 200 mg via INTRAVENOUS

## 2023-06-16 MED ORDER — DOCUSATE SODIUM 100 MG PO CAPS
100.0000 mg | ORAL_CAPSULE | Freq: Two times a day (BID) | ORAL | Status: DC
  Administered 2023-06-16 – 2023-06-19 (×6): 100 mg via ORAL
  Filled 2023-06-16 (×6): qty 1

## 2023-06-16 MED ORDER — METOCLOPRAMIDE HCL 5 MG PO TABS: 5.0000 mg | ORAL_TABLET | Freq: Three times a day (TID) | ORAL | Status: DC | PRN

## 2023-06-16 MED ORDER — LACTATED RINGERS IV SOLN: INTRAVENOUS | Status: DC

## 2023-06-16 MED ORDER — METOCLOPRAMIDE HCL 5 MG/ML IJ SOLN: 5.0000 mg | Freq: Three times a day (TID) | INTRAMUSCULAR | Status: DC | PRN

## 2023-06-16 MED ORDER — GABAPENTIN 300 MG PO CAPS
ORAL_CAPSULE | ORAL | Status: AC
  Administered 2023-06-16: 300 mg via ORAL
  Filled 2023-06-16: qty 1

## 2023-06-16 MED ORDER — ACETAMINOPHEN 500 MG PO TABS
ORAL_TABLET | ORAL | Status: AC
  Administered 2023-06-16: 1000 mg via ORAL
  Filled 2023-06-16: qty 2

## 2023-06-16 MED ORDER — CEFAZOLIN SODIUM-DEXTROSE 2-4 GM/100ML-% IV SOLN: 2.0000 g | Freq: Three times a day (TID) | INTRAVENOUS | Status: DC

## 2023-06-16 SURGICAL SUPPLY — 77 items
BAG COUNTER SPONGE SURGICOUNT (BAG) ×4 IMPLANT
BANDAGE ESMARK 6X9 LF (GAUZE/BANDAGES/DRESSINGS) IMPLANT
BENZOIN TINCTURE PRP APPL 2/3 (GAUZE/BANDAGES/DRESSINGS) ×2 IMPLANT
BIT DRILL 2.4X140 LONG SOLID (BIT) IMPLANT
BLADE CLIPPER SURG (BLADE) IMPLANT
BLADE SAW SGTL 18X1.27X75 (BLADE) ×2 IMPLANT
BNDG COHESIVE 4X5 TAN STRL LF (GAUZE/BANDAGES/DRESSINGS) ×2 IMPLANT
BNDG COHESIVE 6X5 TAN ST LF (GAUZE/BANDAGES/DRESSINGS) ×2 IMPLANT
BNDG ELASTIC 6INX 5YD STR LF (GAUZE/BANDAGES/DRESSINGS) IMPLANT
BNDG ESMARK 6X9 LF (GAUZE/BANDAGES/DRESSINGS) IMPLANT
BNDG GAUZE DERMACEA FLUFF 4 (GAUZE/BANDAGES/DRESSINGS) ×2 IMPLANT
COVER SURGICAL LIGHT HANDLE (MISCELLANEOUS) ×4 IMPLANT
CUP ACET PNNCL SECTR W/GRIP 56 (Hips) IMPLANT
DRAPE C-ARM 42X120 X-RAY (DRAPES) ×2 IMPLANT
DRAPE C-ARM 42X72 X-RAY (DRAPES) ×2 IMPLANT
DRAPE OEC MINIVIEW 54X84 (DRAPES) IMPLANT
DRAPE STERI IOBAN 125X83 (DRAPES) ×2 IMPLANT
DRAPE U-SHAPE 47X51 STRL (DRAPES) ×8 IMPLANT
DRSG ADAPTIC 3X8 NADH LF (GAUZE/BANDAGES/DRESSINGS) ×2 IMPLANT
DRSG AQUACEL AG ADV 3.5X10 (GAUZE/BANDAGES/DRESSINGS) ×2 IMPLANT
DRSG XEROFORM 1X8 (GAUZE/BANDAGES/DRESSINGS) IMPLANT
DURAPREP 26ML APPLICATOR (WOUND CARE) ×4 IMPLANT
ELECT BLADE 4.0 EZ CLEAN MEGAD (MISCELLANEOUS) ×2 IMPLANT
ELECT BLADE 6.5 EXT (BLADE) IMPLANT
ELECT REM PT RETURN 9FT ADLT (ELECTROSURGICAL) ×4 IMPLANT
ELECTRODE BLDE 4.0 EZ CLN MEGD (MISCELLANEOUS) ×2 IMPLANT
ELECTRODE REM PT RTRN 9FT ADLT (ELECTROSURGICAL) ×4 IMPLANT
FACESHIELD WRAPAROUND (MASK) ×4 IMPLANT
FACESHIELD WRAPAROUND OR TEAM (MASK) ×4 IMPLANT
GAUZE PAD ABD 8X10 STRL (GAUZE/BANDAGES/DRESSINGS) ×2 IMPLANT
GAUZE SPONGE 4X4 12PLY STRL (GAUZE/BANDAGES/DRESSINGS) ×2 IMPLANT
GLOVE BIOGEL PI IND STRL 6.5 (GLOVE) ×2 IMPLANT
GLOVE BIOGEL PI IND STRL 8 (GLOVE) ×6 IMPLANT
GLOVE ECLIPSE 6.0 STRL STRAW (GLOVE) ×2 IMPLANT
GLOVE ECLIPSE 8.0 STRL XLNG CF (GLOVE) ×2 IMPLANT
GLOVE INDICATOR 8.0 STRL GRN (GLOVE) ×2 IMPLANT
GLOVE ORTHO TXT STRL SZ7.5 (GLOVE) ×4 IMPLANT
GOWN STRL REUS W/ TWL LRG LVL3 (GOWN DISPOSABLE) ×6 IMPLANT
GOWN STRL REUS W/ TWL XL LVL3 (GOWN DISPOSABLE) ×10 IMPLANT
HEAD CERAMIC 36 PLUS 8.5 12 14 (Hips) IMPLANT
HEAD CERAMIC BIO DELTA 36 (Hips) IMPLANT
KIT BASIN OR (CUSTOM PROCEDURE TRAY) ×4 IMPLANT
KIT TURNOVER KIT B (KITS) ×4 IMPLANT
MANIFOLD NEPTUNE II (INSTRUMENTS) ×4 IMPLANT
NS IRRIG 1000ML POUR BTL (IV SOLUTION) ×4 IMPLANT
PACK ORTHO EXTREMITY (CUSTOM PROCEDURE TRAY) ×2 IMPLANT
PACK TOTAL JOINT (CUSTOM PROCEDURE TRAY) ×2 IMPLANT
PAD ARMBOARD POSITIONER FOAM (MISCELLANEOUS) ×6 IMPLANT
PADDING CAST ABS COTTON 4X4 ST (CAST SUPPLIES) IMPLANT
PINN SECTOR W/GRIP ACE CUP 56 (Hips) ×2 IMPLANT
PINNACLE ALTRX PLUS 4 N 36X56 (Hips) IMPLANT
PLATE TIBIA PREC 7H (Plate) IMPLANT
SCREW CANN NL 3.5X40 (Screw) IMPLANT
SCREW LOCK PLATE R3 3.5X14 (Screw) IMPLANT
SCREW NON LOCKING 3.5X12 (Screw) IMPLANT
SCREW NON LOCKING 3.5X14 (Screw) IMPLANT
SCREW PLT 36X3.5X NONLOCK (Screw) IMPLANT
SCREW R3CON NONLOCK 3.5X38 (Screw) IMPLANT
SET HNDPC FAN SPRY TIP SCT (DISPOSABLE) ×2 IMPLANT
STAPLER SKIN PROX 35W (STAPLE) IMPLANT
STAPLER VISISTAT 35W (STAPLE) IMPLANT
STEM FEMORAL SZ5 HIGH ACTIS (Stem) IMPLANT
STRIP CLOSURE SKIN 1/2X4 (GAUZE/BANDAGES/DRESSINGS) ×4 IMPLANT
SUCTION TUBE FRAZIER 10FR DISP (SUCTIONS) ×2 IMPLANT
SUT ETHIBOND NAB CT1 #1 30IN (SUTURE) ×2 IMPLANT
SUT ETHILON 3 0 FSL (SUTURE) ×4 IMPLANT
SUT MNCRL AB 4-0 PS2 18 (SUTURE) IMPLANT
SUT VIC AB 0 CT1 27XBRD ANBCTR (SUTURE) ×2 IMPLANT
SUT VIC AB 1 CT1 27XBRD ANBCTR (SUTURE) ×2 IMPLANT
SUT VIC AB 2-0 CT1 TAPERPNT 27 (SUTURE) ×4 IMPLANT
TOWEL GREEN STERILE (TOWEL DISPOSABLE) ×4 IMPLANT
TOWEL GREEN STERILE FF (TOWEL DISPOSABLE) ×4 IMPLANT
TRAY CATH INTERMITTENT SS 16FR (CATHETERS) IMPLANT
TRAY FOLEY W/BAG SLVR 16FR ST (SET/KITS/TRAYS/PACK) IMPLANT
TUBE CONNECTING 12X1/4 (SUCTIONS) ×2 IMPLANT
WATER STERILE IRR 1000ML POUR (IV SOLUTION) ×4 IMPLANT
WIRE OLIVE SMOOTH 1.4MMX60MM (WIRE) IMPLANT

## 2023-06-16 NOTE — Brief Op Note (Signed)
   Brief Op Note  Date of Surgery: 06/16/2023  Preoperative Diagnosis: Right Hip fracture, Right Ankle fracture  Postoperative Diagnosis: same  Procedure: Procedure(s): ARTHROPLASTY, HIP, TOTAL, ANTERIOR APPROACH OPEN REDUCTION INTERNAL FIXATION (ORIF) ANKLE FRACTURE  Implants: Implant Name Type Inv. Item Serial No. Manufacturer Lot No. LRB No. Used Action  PINN SECTOR W/GRIP ACE CUP 56 - [id number] Hips PINN SECTOR W/GRIP ACE CUP 56  [employer] [id number] Right 1 Implanted  PINNACLE ALTRX PLUS 4 N 36X56 - [id number] Hips PINNACLE ALTRX PLUS 4 N 36X56  [employer] [id number] Right 1 Implanted  STEM FEMORAL SZ5 HIGH ACTIS - [id number] Stem STEM FEMORAL SZ5 HIGH ACTIS  [employer] [id number] Right 1 Implanted  HEAD CERAMIC BIO DELTA 36 - [id number] Hips HEAD CERAMIC BIO DELTA 36  [employer] [id number] Right 1 Implanted    Surgeons: Surgeon(s): Kathryne Hitch, MD [name], MD  Anesthesia: General    Estimated Blood Loss: See anesthesia record  Complications: None  Condition to PACU: Stable  Benancio Deeds, MD 06/16/2023 4:04 PM

## 2023-06-16 NOTE — Interval H&P Note (Signed)
 History and Physical Interval Note:  06/16/2023 1:07 PM  Richard Walters  has presented today for surgery, with the diagnosis of Right Hip fx, Right Ankle Fx.  The various methods of treatment have been discussed with the patient and family. After consideration of risks, benefits and other options for treatment, the patient has consented to  Procedure(s): ARTHROPLASTY, HIP, TOTAL, ANTERIOR APPROACH (Right) OPEN REDUCTION INTERNAL FIXATION (ORIF) ANKLE FRACTURE (Right) as a surgical intervention.  The patient's history has been reviewed, patient examined, no change in status, stable for surgery.  I have reviewed the patient's chart and labs.  Questions were answered to the patient's satisfaction.     [name]
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
## 2023-06-16 NOTE — Anesthesia Preprocedure Evaluation (Addendum)
 Anesthesia Evaluation  Patient identified by MRN, date of birth, ID band Patient awake    Reviewed: Allergy & Precautions, NPO status , Patient's Chart, lab work & pertinent test results  History of Anesthesia Complications Negative for: history of anesthetic complications  Airway Mallampati: II  TM Distance: >3 FB Neck ROM: Full    Dental  (+) Dental Advisory Given, Teeth Intact   Pulmonary neg pulmonary ROS   Pulmonary exam normal        Cardiovascular negative cardio ROS Normal cardiovascular exam     Neuro/Psych negative neurological ROS  negative psych ROS   GI/Hepatic negative GI ROS, Neg liver ROS,,,  Endo/Other  negative endocrine ROS    Renal/GU negative Renal ROS     Musculoskeletal negative musculoskeletal ROS (+)    Abdominal   Peds  Hematology negative hematology ROS (+)   Anesthesia Other Findings   Reproductive/Obstetrics                             Anesthesia Physical Anesthesia Plan  ASA: 1  Anesthesia Plan: General   Post-op Pain Management: Tylenol PO (pre-op)*, Celebrex PO (pre-op)* and Ketamine IV*   Induction: Intravenous  PONV Risk Score and Plan: 2 and Treatment may vary due to age or medical condition, Ondansetron, Dexamethasone and Midazolam  Airway Management Planned: Oral ETT  Additional Equipment: None  Intra-op Plan:   Post-operative Plan: Extubation in OR  Informed Consent: I have reviewed the patients History and Physical, chart, labs and discussed the procedure including the risks, benefits and alternatives for the proposed anesthesia with the patient or authorized representative who has indicated his/her understanding and acceptance.     Dental advisory given  Plan Discussed with: CRNA and Anesthesiologist  Anesthesia Plan Comments:         Anesthesia Quick Evaluation

## 2023-06-16 NOTE — Discharge Instructions (Signed)
 Discharge Instructions    Attending Surgeon: [name], MD Office Phone Number: [phone]   Diagnosis and Procedures:    Surgeries Performed: Right hip arthroplasty, right ankle fixation  Discharge Plan:    Diet: Resume usual diet. Begin with light or bland foods.  Drink plenty of fluids.  Activity:  Weight bearing as tolerated right leg. You are advised to go home directly from the hospital or surgical center. Restrict your activities.  GENERAL INSTRUCTIONS: 1.  Please apply ice to your wound to help with swelling and inflammation. This will improve your comfort and your overall recovery following surgery.     2. Please call Dr. Serena Croissant office at [phone] with questions Monday-Friday during business hours. If no one answers, please leave a message and someone should get back to the patient within 24 hours. For emergencies please call 911 or proceed to the emergency room.   3. Patient to notify surgical team if experiences any of the following: Bowel/Bladder dysfunction, uncontrolled pain, nerve/muscle weakness, incision with increased drainage or redness, nausea/vomiting and Fever greater than 101.0 F.  Be alert for signs of infection including redness, streaking, odor, fever or chills. Be alert for excessive pain or bleeding and notify your surgeon immediately.  WOUND INSTRUCTIONS:   Leave your dressing, cast, or splint in place until your post operative visit.  Keep it clean and dry.  Always keep the incision clean and dry until the staples/sutures are removed. If there is no drainage from the incision you should keep it open to air. If there is drainage from the incision you must keep it covered at all times until the drainage stops  Do not soak in a bath tub, hot tub, pool, lake or other body of water until 21 days after your surgery and your incision is completely dry and healed.  If you have removable sutures (or staples) they must be removed 10-14 days  (unless otherwise instructed) from the day of your surgery.     1)  Elevate the extremity as much as possible.  2)  Keep the dressing clean and dry.  3)  Please call us if the dressing becomes wet or dirty.  4)  If you are experiencing worsening pain or worsening swelling, please call.     MEDICATIONS: Resume all previous home medications at the previous prescribed dose and frequency unless otherwise noted Start taking the  pain medications on an as-needed basis as prescribed  Please taper down pain medication over the next week following surgery.  Ideally you should not require a refill of any narcotic pain medication.  Take pain medication with food to minimize nausea. In addition to the prescribed pain medication, you may take over-the-counter pain relievers such as Tylenol.  Do NOT take additional tylenol if your pain medication already has tylenol in it.  Aspirin 325mg  daily per instructions on bottle. Narcotic policy: Per [facility] clinic policy, our goal is ensure optimal postoperative pain control with a multimodal pain management strategy. For all [facility] patients, our goal is to wean post-operative narcotic medications by 6 weeks post-operatively, and many times sooner. If this is not possible due to utilization of pain medication prior to surgery, your [facility] doctor will support your acute post-operative pain control for the first 6 weeks postoperatively, with a plan to transition you back to your primary pain team following that. Cyndia Skeeters will work to ensure a Therapist, occupational.       FOLLOWUP INSTRUCTIONS: 1. Follow up at the  Physical Therapy Clinic 3-4 days following surgery. This appointment should be scheduled unless other arrangements have been made.The Physical Therapy scheduling number is [phone] if an appointment has not already been arranged.  2. Contact Dr. Serena Croissant office during office hours at [phone] or the practice after hours line at [phone] for  non-emergencies. For medical emergencies call 911.   Discharge Location: Home   INSTRUCTIONS AFTER JOINT REPLACEMENT   Remove items at home which could result in a fall. This includes throw rugs or furniture in walking pathways ICE to the affected joint every three hours while awake for 30 minutes at a time, for at least the first 3-5 days, and then as needed for pain and swelling.  Continue to use ice for pain and swelling. You may notice swelling that will progress down to the foot and ankle.  This is normal after surgery.  Elevate your leg when you are not up walking on it.   Continue to use the breathing machine you got in the hospital (incentive spirometer) which will help keep your temperature down.  It is common for your temperature to cycle up and down following surgery, especially at night when you are not up moving around and exerting yourself.  The breathing machine keeps your lungs expanded and your temperature down.   DIET:  As you were doing prior to hospitalization, we recommend a well-balanced diet.  DRESSING / WOUND CARE / SHOWERING  Keep the surgical dressing until follow up.  The dressing is water proof, so you can shower without any extra covering.  IF THE DRESSING FALLS OFF or the wound gets wet inside, change the dressing with sterile gauze.  Please use good hand washing techniques before changing the dressing.  Do not use any lotions or creams on the incision until instructed by your surgeon.    ACTIVITY  Increase activity slowly as tolerated, but follow the weight bearing instructions below.   No driving for 6 weeks or until further direction given by your physician.  You cannot drive while taking narcotics.  No lifting or carrying greater than 10 lbs. until further directed by your surgeon. Avoid periods of inactivity such as sitting longer than an hour when not asleep. This helps prevent blood clots.  You may return to work once you are authorized by your doctor.      WEIGHT BEARING   Weight bearing as tolerated with assist device (walker, cane, etc) as directed, use it as long as suggested by your surgeon or therapist, typically at least 4-6 weeks.   EXERCISES  Results after joint replacement surgery are often greatly improved when you follow the exercise, range of motion and muscle strengthening exercises prescribed by your doctor. Safety measures are also important to protect the joint from further injury. Any time any of these exercises cause you to have increased pain or swelling, decrease what you are doing until you are comfortable again and then slowly increase them. If you have problems or questions, call your caregiver or physical therapist for advice.   Rehabilitation is important following a joint replacement. After just a few days of immobilization, the muscles of the leg can become weakened and shrink (atrophy).  These exercises are designed to build up the tone and strength of the thigh and leg muscles and to improve motion. Often times heat used for twenty to thirty minutes before working out will loosen up your tissues and help with improving the range of motion but do not use heat  for the first two weeks following surgery (sometimes heat can increase post-operative swelling).   These exercises can be done on a training (exercise) mat, on the floor, on a table or on a bed. Use whatever works the best and is most comfortable for you.    Use music or television while you are exercising so that the exercises are a pleasant break in your day. This will make your life better with the exercises acting as a break in your routine that you can look forward to.   Perform all exercises about fifteen times, three times per day or as directed.  You should exercise both the operative leg and the other leg as well.  Exercises include:   Quad Sets - Tighten up the muscle on the front of the thigh (Quad) and hold for 5-10 seconds.   Straight Leg Raises -  With your knee straight (if you were given a brace, keep it on), lift the leg to 60 degrees, hold for 3 seconds, and slowly lower the leg.  Perform this exercise against resistance later as your leg gets stronger.  Leg Slides: Lying on your back, slowly slide your foot toward your buttocks, bending your knee up off the floor (only go as far as is comfortable). Then slowly slide your foot back down until your leg is flat on the floor again.  Angel Wings: Lying on your back spread your legs to the side as far apart as you can without causing discomfort.  Hamstring Strength:  Lying on your back, push your heel against the floor with your leg straight by tightening up the muscles of your buttocks.  Repeat, but this time bend your knee to a comfortable angle, and push your heel against the floor.  You may put a pillow under the heel to make it more comfortable if necessary.   A rehabilitation program following joint replacement surgery can speed recovery and prevent re-injury in the future due to weakened muscles. Contact your doctor or a physical therapist for more information on knee rehabilitation.    CONSTIPATION  Constipation is defined medically as fewer than three stools per week and severe constipation as less than one stool per week.  Even if you have a regular bowel pattern at home, your normal regimen is likely to be disrupted due to multiple reasons following surgery.  Combination of anesthesia, postoperative narcotics, change in appetite and fluid intake all can affect your bowels.   YOU MUST use at least one of the following options; they are listed in order of increasing strength to get the job done.  They are all available over the counter, and you may need to use some, POSSIBLY even all of these options:    Drink plenty of fluids (prune juice may be helpful) and high fiber foods Colace 100 mg by mouth twice a day  Senokot for constipation as directed and as needed Dulcolax (bisacodyl),  take with full glass of water  Miralax (polyethylene glycol) once or twice a day as needed.  If you have tried all these things and are unable to have a bowel movement in the first 3-4 days after surgery call either your surgeon or your primary doctor.    If you experience loose stools or diarrhea, hold the medications until you stool forms back up.  If your symptoms do not get better within 1 week or if they get worse, check with your doctor.  If you experience "the worst abdominal pain ever" or develop  nausea or vomiting, please contact the office immediately for further recommendations for treatment.   ITCHING:  If you experience itching with your medications, try taking only a single pain pill, or even half a pain pill at a time.  You can also use Benadryl over the counter for itching or also to help with sleep.   TED HOSE STOCKINGS:  Use stockings on both legs until for at least 2 weeks or as directed by physician office. They may be removed at night for sleeping.  MEDICATIONS:  See your medication summary on the "After Visit Summary" that nursing will review with you.  You may have some home medications which will be placed on hold until you complete the course of blood thinner medication.  It is important for you to complete the blood thinner medication as prescribed.  PRECAUTIONS:  If you experience chest pain or shortness of breath - call 911 immediately for transfer to the hospital emergency department.   If you develop a fever greater that 101 F, purulent drainage from wound, increased redness or drainage from wound, foul odor from the wound/dressing, or calf pain - CONTACT YOUR SURGEON.                                                   FOLLOW-UP APPOINTMENTS:  If you do not already have a post-op appointment, please call the office for an appointment to be seen by your surgeon.  Guidelines for how soon to be seen are listed in your "After Visit Summary", but are typically between 1-4  weeks after surgery.  OTHER INSTRUCTIONS:   Knee Replacement:  Do not place pillow under knee, focus on keeping the knee straight while resting. CPM instructions: 0-90 degrees, 2 hours in the morning, 2 hours in the afternoon, and 2 hours in the evening. Place foam block, curve side up under heel at all times except when in CPM or when walking.  DO NOT modify, tear, cut, or change the foam block in any way.  POST-OPERATIVE OPIOID TAPER INSTRUCTIONS: It is important to wean off of your opioid medication as soon as possible. If you do not need pain medication after your surgery it is ok to stop day one. Opioids include: Codeine, Hydrocodone(Norco, Vicodin), Oxycodone(Percocet, oxycontin) and hydromorphone amongst others.  Long term and even short term use of opiods can cause: Increased pain response Dependence Constipation Depression Respiratory depression And more.  Withdrawal symptoms can include Flu like symptoms Nausea, vomiting And more Techniques to manage these symptoms Hydrate well Eat regular healthy meals Stay active Use relaxation techniques(deep breathing, meditating, yoga) Do Not substitute Alcohol to help with tapering If you have been on opioids for less than two weeks and do not have pain than it is ok to stop all together.  Plan to wean off of opioids This plan should start within one week post op of your joint replacement. Maintain the same interval or time between taking each dose and first decrease the dose.  Cut the total daily intake of opioids by one tablet each day Next start to increase the time between doses. The last dose that should be eliminated is the evening dose.   MAKE SURE YOU:  Understand these instructions.  Get help right away if you are not doing well or get worse.    Thank you for letting  us be a part of your medical care team.  It is a privilege we respect greatly.  We hope these instructions will help you stay on track for a fast and full  recovery!     Dental Antibiotics:  In most cases prophylactic antibiotics for Dental procdeures after total joint surgery are not necessary.  Exceptions are as follows:  1. History of prior total joint infection  2. Severely immunocompromised (Organ Transplant, cancer chemotherapy, Rheumatoid biologic meds such as Humera)  3. Poorly controlled diabetes (A1C &gt; 8.0, blood glucose over 200)  If you have one of these conditions, contact your surgeon for an antibiotic prescription, prior to your dental procedure.

## 2023-06-16 NOTE — Transfer of Care (Signed)
 Immediate Anesthesia Transfer of Care Note  Patient: Richard Walters  Procedure(s) Performed: ARTHROPLASTY, HIP, TOTAL, ANTERIOR APPROACH (Right: Hip) OPEN REDUCTION INTERNAL FIXATION (ORIF) ANKLE FRACTURE (Right: Ankle)  Patient Location: PACU  Anesthesia Type:General  Level of Consciousness: awake, alert , and oriented  Airway & Oxygen Therapy: Patient Spontanous Breathing  Post-op Assessment: Report given to RN, Post -op Vital signs reviewed and stable, and Patient moving all extremities  Post vital signs: Reviewed and stable  Last Vitals:  Vitals Value Taken Time  BP 121/79 06/16/23 1620  Temp 36.7 C 06/16/23 1620  Pulse 76 06/16/23 1622  Resp 14 06/16/23 1624  SpO2 92 % 06/16/23 1622  Vitals shown include unfiled device data.  Last Pain:  Vitals:   06/16/23 1242  TempSrc: (P) Oral  PainSc:       Patients Stated Pain Goal: 5 (06/15/23 2000)  Complications: There were no known notable events for this encounter.

## 2023-06-16 NOTE — Op Note (Signed)
   Date of Surgery: 06/16/2023  INDICATIONS: Richard Walters is a 53 y.o.-year-old male with right displaced medial malleolus ankle fracture.  The risk and benefits of the procedure were discussed in detail and documented in the pre-operative evaluation.   PREOPERATIVE DIAGNOSIS: 1.  Displaced medial malleolus ankle fracture 2.  Open wound mid tibia  POSTOPERATIVE DIAGNOSIS: Same.  PROCEDURE: 1.  Open reduction internal fixation right medial malleolus 2.  Debridement down to bone distal tibia shaft with excision nonviable tissue  SURGEON: Richard Deeds MD  ASSISTANT: Not applicable  ANESTHESIA:  general  IV FLUIDS AND URINE: See anesthesia record.  ANTIBIOTICS: Ancef  ESTIMATED BLOOD LOSS: 10 mL.  IMPLANTS:  Implant Name Type Inv. Item Serial No. Manufacturer Lot No. LRB No. Used Action  PINN SECTOR W/GRIP ACE CUP 56 - [id number] Hips PINN SECTOR W/GRIP ACE CUP 56  [employer] [id number] Right 1 Implanted  PINNACLE ALTRX PLUS 4 N 36X56 - [id number] Hips PINNACLE ALTRX PLUS 4 N 36X56  [employer] [id number] Right 1 Implanted  STEM FEMORAL SZ5 HIGH ACTIS - [id number] Stem STEM FEMORAL SZ5 HIGH ACTIS  [employer]PU[employer]TH[employer] [id number] Right 1 Implanted  HEAD CERAMIC BIO DELTA 36 - [id number] Hips HEAD CERAMIC BIO DELTA 36  [employer] [id number] Right 1 Implanted    DRAINS: None  CULTURES: None  COMPLICATIONS: none  DESCRIPTION OF PROCEDURE:   The patient had been anesthetized from the previous total hip arthroplasty.  This portion of the note has been dictated by Richard Walters.  I had been assisting on this case.  Finalizing this case the drapes were removed.  The Hana table bed positioning was placed.  Right lower extremity was prepped and draped in the usual sterile fashion.  Timeout was again performed.  At this point I remove the staples from a previous closure in the ED from a more proximal tibial wound.  This revealed underlying bone with a small  fragment.  This was irrigated and debridement and removed sharply with 15 blade.  Skin edges were debrided with 15 blade.  Wound closure was performed after 3 L of normal saline was irrigated through the wound.  This was done with horizontal mattress sutures 3-0 nylon.  Tension was then turned to the medial malleolus.  15 blade was used to incise in line with medial malleolus.  Electrocautery was used to achieve hemostasis from the saphenous branches.  The fracture vertically was identified and periosteum was elevated around this.  At this time a low-profile plate was selected and placed.  This was held provisionally with an olive tip guidewire.  AP and lateral fluoroscopy confirmed appropriate position.  At this time, the buttress screw was placed.  This had excellent reduction of the fracture.  The additional proximal 2 screws were placed.  2 screws distally were placed into the distal aspect of the medial malleolus.  This had excellent anatomic reduction on AP and lateral fluoroscopy.  The wound was thoroughly irrigated and closed in layers of 2-0 Vicryl and 3-0 nylon.  Soft dressing was applied with Xeroform gauze Webril and Ace wrap.  All counts were correct at the end of the case.  He was taken PACU without complication.   POSTOPERATIVE PLAN: Will be weightbearing as tolerated on the right leg in a cam boot walker.  He will be seen by physical therapy immediately postop.  Placed on aspirin for blood clot prevention  Richard Deeds, MD 4:04 PM

## 2023-06-16 NOTE — Anesthesia Procedure Notes (Signed)
 Procedure Name: Intubation Date/Time: 06/16/2023 1:57 PM  Performed by: Orlin Hilding, CRNAPre-anesthesia Checklist: Patient identified, Emergency Drugs available, Suction available, Patient being monitored and Timeout performed Patient Re-evaluated:Patient Re-evaluated prior to induction Oxygen Delivery Method: Circle system utilized Preoxygenation: Pre-oxygenation with 100% oxygen Induction Type: IV induction Ventilation: Mask ventilation without difficulty and Oral airway inserted - appropriate to patient size Laryngoscope Size: Mac and 4 Grade View: Grade I Tube type: Oral Tube size: 7.5 mm Number of attempts: 1 Placement Confirmation: ETT inserted through vocal cords under direct vision, positive ETCO2 and breath sounds checked- equal and bilateral Secured at: 23 cm Tube secured with: Tape Dental Injury: Teeth and Oropharynx as per pre-operative assessment

## 2023-06-16 NOTE — Progress Notes (Signed)
 PT Cancellation Note  Patient Details Name: Richard Walters MRN: [id number] DOB: October 09, 1970   Cancelled Treatment:    Reason Eval/Treat Not Completed: Patient at procedure or test/unavailable  OR today;   Will follow up fro PT eval postop;   Van Clines, PT  Acute Rehabilitation Services Office [phone] Secure Chat welcomed    [name] 06/16/2023, 1:23 PM

## 2023-06-16 NOTE — Plan of Care (Signed)

## 2023-06-16 NOTE — Progress Notes (Signed)
 Orthopedic Tech Progress Note Patient Details:  DINK CREPS 1970-05-01 [id number]  Ortho Devices Type of Ortho Device: CAM walker Ortho Device/Splint Location: OR desk Ortho Device/Splint Interventions: Ordered   Post Interventions Patient Tolerated: Well Instructions Provided: Care of device, Adjustment of device  Hoke Baer Carmine Savoy 06/16/2023, 6:53 PM

## 2023-06-16 NOTE — Anesthesia Postprocedure Evaluation (Signed)
 Anesthesia Post Note  Patient: SAYF KERNER  Procedure(s) Performed: ARTHROPLASTY, HIP, TOTAL, ANTERIOR APPROACH (Right: Hip) OPEN REDUCTION INTERNAL FIXATION (ORIF) ANKLE FRACTURE (Right: Ankle)     Patient location during evaluation: PACU Anesthesia Type: General Level of consciousness: awake and alert Pain management: pain level controlled Vital Signs Assessment: post-procedure vital signs reviewed and stable Respiratory status: spontaneous breathing, nonlabored ventilation and respiratory function stable Cardiovascular status: stable and blood pressure returned to baseline Anesthetic complications: no  There were no known notable events for this encounter.  Last Vitals:  Vitals:   06/16/23 1700 06/16/23 1710  BP: 126/83   Pulse: 73 71  Resp: 14 12  Temp:  36.8 C  SpO2: 96% 99%    Last Pain:  Vitals:   06/16/23 1710  TempSrc:   PainSc: 4         RLE Motor Response: Purposeful movement;Responds to commands (06/16/23 1710) RLE Sensation: Full sensation (06/16/23 1710)      Beryle Lathe

## 2023-06-17 ENCOUNTER — Encounter (HOSPITAL_COMMUNITY): Payer: Self-pay | Admitting: Orthopaedic Surgery

## 2023-06-17 LAB — BASIC METABOLIC PANEL WITH GFR
Anion gap: 7 (ref 5–15)
BUN: 10 mg/dL (ref 6–20)
CO2: 24 mmol/L (ref 22–32)
Calcium: 7.7 mg/dL — ABNORMAL LOW (ref 8.9–10.3)
Chloride: 105 mmol/L (ref 98–111)
Creatinine, Ser: 1.01 mg/dL (ref 0.61–1.24)
GFR, Estimated: >60 mL/min (ref >60)
Glucose, Bld: 107 mg/dL — ABNORMAL HIGH (ref 70–99)
Potassium: 3.9 mmol/L (ref 3.5–5.1)
Sodium: 136 mmol/L (ref 135–145)

## 2023-06-17 LAB — CBC
HCT: 37.6 % — ABNORMAL LOW (ref 39.0–52.0)
Hemoglobin: 12.4 g/dL — ABNORMAL LOW (ref 13.0–17.0)
MCH: 30 pg (ref 26.0–34.0)
MCHC: 33 g/dL (ref 30.0–36.0)
MCV: 91 fL (ref 80.0–100.0)
Platelets: 127 10*3/uL — ABNORMAL LOW (ref 150–400)
RBC: 4.13 MIL/uL — ABNORMAL LOW (ref 4.22–5.81)
RDW: 13.7 % (ref 11.5–15.5)
WBC: 11.3 10*3/uL — ABNORMAL HIGH (ref 4.0–10.5)
nRBC: 0 % (ref 0.0–0.2)

## 2023-06-17 MED ORDER — ASPIRIN 325 MG PO TABS
325.0000 mg | ORAL_TABLET | Freq: Every day | ORAL | Status: DC
  Administered 2023-06-17 – 2023-06-19 (×3): 325 mg via ORAL
  Filled 2023-06-17 (×3): qty 1

## 2023-06-17 NOTE — Progress Notes (Signed)
   Subjective:  Patient reports pain as mild.  Overall he has not had physical therapy yet.  Pain is well-controlled, voiding, tolerating diet  Objective:   VITALS:   Vitals:   06/16/23 1710 06/16/23 2025 06/17/23 0434 06/17/23 0748  BP: 123/89 107/82 95/64 113/71  Pulse: 71 85 78 80  Resp: 12 17 17 18   Temp: 98.2 F (36.8 C) 98.7 F (37.1 C) 98.6 F (37 C) 98.4 F (36.9 C)  TempSrc:  Oral Oral   SpO2: 99% 93% 92% 90%  Weight:      Height:        Dressing is clean dry intact including the right hip and right ankle.  Fires tibialis anterior as well as gastrocsoleus.  Wound is clean dry and intact.  Lab Results  Component Value Date   WBC 10.1 06/15/2023   HGB 13.6 06/15/2023   HCT 40.5 06/15/2023   MCV 90.8 06/15/2023   PLT 182 06/15/2023     Assessment/Plan:  1 Day Post-Op status post right total hip arthroplasty and right ankle open reduction internal fixation overall doing extremely well  - Expected postop acute blood loss anemia - will monitor for symptoms - Patient to work with PT to optimize mobilization safely - DVT ppx - SCDs, ambulation, aspirin 325 - WBAT operative extremity - Pain control - multimodal pain management, ATC acetaminophen in conjunction with as needed narcotic (oxycodone), although this should be minimized with other modalities  - Discharge planning pending CM, appreciate coordination   Richard Walters 06/17/2023, 7:58 AM
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
## 2023-06-17 NOTE — Plan of Care (Signed)

## 2023-06-17 NOTE — Progress Notes (Signed)
 Transition of Care [facility]) - CAGE-AID Screening   Patient Details  Name: Richard Walters MRN: [id number] Date of Birth: [birth year]  Transition of [facility]) CM/SW Contact:    Leota Sauers, RN Phone Number: 06/17/2023, 8:57 PM   Clinical Narrative:  Patient denies the use of alcohol and illicit substances. Resources not given at this time.  CAGE-AID Screening:    Have You Ever Felt You Ought to Cut Down on Your Drinking or Drug Use?: No Have People Annoyed You By Critizing Your Drinking Or Drug Use?: No Have You Felt Bad Or Guilty About Your Drinking Or Drug Use?: No Have You Ever Had a Drink or Used Drugs First Thing In The Morning to Steady Your Nerves or to Get Rid of a Hangover?: No CAGE-AID Score: 0  Substance Abuse Education Offered: No

## 2023-06-17 NOTE — Plan of Care (Signed)

## 2023-06-18 DIAGNOSIS — F439 Reaction to severe stress, unspecified: Secondary | ICD-10-CM

## 2023-06-18 MED ORDER — ACETAMINOPHEN 500 MG PO TABS: 500.0000 mg | ORAL_TABLET | Freq: Three times a day (TID) | ORAL | 0 refills | Status: AC

## 2023-06-18 MED ORDER — IBUPROFEN 800 MG PO TABS: 800.0000 mg | ORAL_TABLET | Freq: Three times a day (TID) | ORAL | 0 refills | Status: AC

## 2023-06-18 MED ORDER — OXYCODONE HCL 5 MG PO TABS: 5.0000 mg | ORAL_TABLET | ORAL | 0 refills | Status: DC | PRN

## 2023-06-18 MED ORDER — ASPIRIN 325 MG PO TBEC: 325.0000 mg | DELAYED_RELEASE_TABLET | Freq: Every day | ORAL | 0 refills | Status: AC

## 2023-06-18 NOTE — Plan of Care (Signed)
  Problem: Education: Goal: Knowledge of General Education information will improve Description: Including pain rating scale, medication(s)/side effects and non-pharmacologic comfort measures Outcome: Progressing   Problem: Activity: Goal: Risk for activity intolerance will decrease Outcome: Progressing   Problem: Elimination: Goal: Will not experience complications related to bowel motility Outcome: Progressing   Problem: Pain Managment: Goal: General experience of comfort will improve and/or be controlled Outcome: Progressing
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
## 2023-06-18 NOTE — Progress Notes (Signed)
   Subjective:  Patient reports pain as mild.  Worked with PT. Pain well controlled  Objective:   VITALS:   Vitals:   06/17/23 1551 06/17/23 2020 06/18/23 0536 06/18/23 0911  BP: 120/69 123/77 101/73 115/77  Pulse: 81 91 91 78  Resp: 18 18  17   Temp: 97.6 F (36.4 C) 99.6 F (37.6 C) 99.6 F (37.6 C) 98.9 F (37.2 C)  TempSrc:  Oral Oral Oral  SpO2: 95% 93% 91% 95%  Weight:      Height:        Dressing is clean dry intact including the right hip and right ankle.  Fires tibialis anterior as well as gastrocsoleus.  Wound is clean dry and intact.  Lab Results  Component Value Date   WBC 11.3 (H) 06/17/2023   HGB 12.4 (L) 06/17/2023   HCT 37.6 (L) 06/17/2023   MCV 91.0 06/17/2023   PLT 127 (L) 06/17/2023     Assessment/Plan:  2 Days Post-Op status post right total hip arthroplasty and right ankle open reduction internal fixation overall doing extremely well  - Expected postop acute blood loss anemia - will monitor for symptoms - Patient to work with PT to optimize mobilization safely - DVT ppx - SCDs, ambulation, aspirin 325 - WBAT operative extremity - Pain control - multimodal pain management, ATC acetaminophen in conjunction with as needed narcotic (oxycodone), although this should be minimized with other modalities  - Discharge planning pending CM, possible DC home pending PT  Richard Walters 06/18/2023, 10:48 AM

## 2023-06-18 NOTE — Plan of Care (Signed)
  Problem: Activity: Goal: Risk for activity intolerance will decrease Outcome: Progressing   Problem: Elimination: Goal: Will not experience complications related to bowel motility Outcome: Progressing   Problem: Pain Managment: Goal: General experience of comfort will improve and/or be controlled Outcome: Progressing

## 2023-06-18 NOTE — Plan of Care (Signed)
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
## 2023-06-19 ENCOUNTER — Other Ambulatory Visit (HOSPITAL_COMMUNITY): Payer: Self-pay
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
## 2023-06-19 NOTE — Progress Notes (Signed)
 DISCHARGE NOTE HOME Richard Walters to be discharged Home per MD order. Discussed prescriptions and follow up appointments with the patient. Prescriptions given to patient; medication list explained in detail. Patient verbalized understanding.  Skin clean, dry and intact without evidence of skin break down, no evidence of skin tears noted. IV catheter discontinued intact. Site without signs and symptoms of complications. Dressing and pressure applied. Pt denies pain at the site currently. No complaints noted.  See LDA for incisions.  An After Visit Summary (AVS) was printed and given to the patient. Patient escorted via wheelchair, and discharged home via private auto.  Richard Meyer, RN

## 2023-06-19 NOTE — Progress Notes (Signed)
   Subjective:  Patient reports pain as mild. Improved mobilization with PT. Pain well controlled  Objective:   VITALS:   Vitals:   06/18/23 0911 06/18/23 1839 06/18/23 2009 06/19/23 0438  BP: 115/77 123/83 (!) 92/55 101/69  Pulse: 78 77 81 72  Resp: 17 17 17 17   Temp: 98.9 F (37.2 C) 99 F (37.2 C) 98.7 F (37.1 C) 98.3 F (36.8 C)  TempSrc: Oral Oral Oral Oral  SpO2: 95% 92% 92% 95%  Weight:      Height:        Dressing is clean dry intact including the right hip and right ankle.  Fires tibialis anterior as well as gastrocsoleus.  Wound is clean dry and intact.  Lab Results  Component Value Date   WBC 11.3 (H) 06/17/2023   HGB 12.4 (L) 06/17/2023   HCT 37.6 (L) 06/17/2023   MCV 91.0 06/17/2023   PLT 127 (L) 06/17/2023     Assessment/Plan:  3 Days Post-Op status post right total hip arthroplasty and right ankle open reduction internal fixation overall doing extremely well  - Expected postop acute blood loss anemia - will monitor for symptoms - Patient to work with PT to optimize mobilization safely - DVT ppx - SCDs, ambulation, aspirin 325 - WBAT operative extremity - Pain control - multimodal pain management, ATC acetaminophen in conjunction with as needed narcotic (oxycodone), although this should be minimized with other modalities  - Discharge planning pending CM, DC home today  Ahmarion Saraceno 06/19/2023, 6:50 AM

## 2023-06-24 ENCOUNTER — Other Ambulatory Visit (HOSPITAL_BASED_OUTPATIENT_CLINIC_OR_DEPARTMENT_OTHER): Payer: Self-pay

## 2023-06-24 ENCOUNTER — Ambulatory Visit (HOSPITAL_BASED_OUTPATIENT_CLINIC_OR_DEPARTMENT_OTHER): Admitting: Student

## 2023-06-24 ENCOUNTER — Encounter (HOSPITAL_BASED_OUTPATIENT_CLINIC_OR_DEPARTMENT_OTHER): Payer: Self-pay | Admitting: Student

## 2023-06-24 ENCOUNTER — Ambulatory Visit (HOSPITAL_BASED_OUTPATIENT_CLINIC_OR_DEPARTMENT_OTHER)

## 2023-06-24 DIAGNOSIS — S82891A Other fracture of right lower leg, initial encounter for closed fracture: Secondary | ICD-10-CM

## 2023-06-24 MED ORDER — OXYCODONE HCL 5 MG PO TABS
5.0000 mg | ORAL_TABLET | Freq: Four times a day (QID) | ORAL | 0 refills | Status: AC | PRN
  Filled 2023-06-24: qty 20, 5d supply, fill #0

## 2023-06-24 NOTE — Progress Notes (Signed)
 Post Operative Evaluation    Procedure/Date of Surgery: Right ankle ORIF 06/16/2023  Interval History:   Patient presents today 9 days status post right ankle open reduction internal fixation.  He also underwent a right total hip arthroplasty with Dr. Magnus Ivan at that time.  He has been able to ambulate some in the walking boot with assistance of a walker.  States that he is continuing to have some moderate pain which is worse at night.  Is taking oxycodone 5 mg for pain.  Overall ankle is more bothersome than the hip.  He is starting with home health PT later this afternoon.   PMH/PSH/Family History/Social History/Meds/Allergies:   History reviewed. No pertinent past medical history. Past Surgical History:  Procedure Laterality Date   ORIF ANKLE FRACTURE Right 06/16/2023   Procedure: OPEN REDUCTION INTERNAL FIXATION (ORIF) ANKLE FRACTURE;  Surgeon: [name], MD;  Location: [facility] OR;  Service: Orthopedics;  Laterality: Right;   STERNUM FRACTURE SURGERY     TOTAL HIP ARTHROPLASTY Right 06/16/2023   Procedure: ARTHROPLASTY, HIP, TOTAL, ANTERIOR APPROACH;  Surgeon: Kathryne Hitch, MD;  Location: [facility] OR;  Service: Orthopedics;  Laterality: Right;   Social History   Socioeconomic History   Marital status: Single    Spouse name: Not on file   Number of children: Not on file   Years of education: Not on file   Highest education level: Not on file  Occupational History   Occupation: Location manager  Tobacco Use   Smoking status: Never   Smokeless tobacco: Never  Vaping Use   Vaping status: Never Used  Substance and Sexual Activity   Alcohol use: Yes    Alcohol/week: 4.0 standard drinks of alcohol    Types: 4 Standard drinks or equivalent per week    Comment: BEER and WINE occ   Drug use: No   Sexual activity: Yes    Birth control/protection: None  Other Topics Concern   Not on file  Social History Narrative   Exercise running,  weights, and cardio   Social Drivers of Health   Financial Resource Strain: Not on file  Food Insecurity: Patient Declined (06/15/2023)   Hunger Vital Sign    Worried About Running Out of Food in the Last Year: Patient declined    Ran Out of Food in the Last Year: Patient declined  Transportation Needs: Patient Declined (06/15/2023)   PRAPARE - Administrator, Civil Service (Medical): Patient declined    Lack of Transportation (Non-Medical): Patient declined  Physical Activity: Not on file  Stress: Not on file  Social Connections: Not on file   Family History  Problem Relation Age of Onset   Hypertension Father    Anemia Sister    Diabetes Paternal Grandmother    Hyperlipidemia Paternal Grandmother    No Known Allergies Current Outpatient Medications  Medication Sig Dispense Refill   acetaminophen (TYLENOL) 500 MG tablet Take 1 tablet (500 mg total) by mouth every 8 (eight) hours for 10 days. 30 tablet 0   aspirin EC 325 MG tablet Take 1 tablet (325 mg total) by mouth daily. 30 tablet 0   ibuprofen (ADVIL) 800 MG tablet Take 1 tablet (800 mg total) by mouth every 8 (eight) hours for 10 days. Please take with food, please alternate with acetaminophen 30 tablet 0  Multiple Vitamin (MULTIVITAMIN) tablet Take 1 tablet by mouth daily.     oxyCODONE (ROXICODONE) 5 MG immediate release tablet Take 1 tablet (5 mg total) by mouth every 6 (six) hours as needed for up to 5 days for severe pain (pain score 7-10) or breakthrough pain. 20 tablet 0   pantoprazole (PROTONIX) 20 MG tablet Take by mouth.     No current facility-administered medications for this visit.   No results found.  Review of Systems:   A ROS was performed including pertinent positives and negatives as documented in the HPI.   Musculoskeletal Exam:    There were no vitals taken for this visit.  Right ankle incisions are well-appearing without evidence of erythema or drainage.  Moderate swelling of the  right ankle and foot.  Pedal pulses palpable.  Distal neurosensory exam intact.  Imaging:   Xray (right ankle 3 views): Right ankle ORIF hardware in good positioning without damage or loosening.  Improved alignment of medial malleolus fracture.   I personally reviewed and interpreted the radiographs.   Assessment:   Patient is 9 days status post right ankle ORIF overall doing well.  He has been compliant with cam boot usage and is beginning PT later today.  Incisions are well-appearing without concern for developing infection, however I would like to leave these in place for another 5 to 7 days to ensure adequate closure.  Can plan to continue with weightbearing as tolerated.  He has postop scheduled with Dr. Magnus Ivan in 1 week.  Will refill oxycodone today for pain but encouraged extending duration between doses.  Will have him follow-up in our clinic at the 4-week mark for next postop and can return for nurse visit for suture removal.  Plan :    -Return to clinic in 3 weeks for reassessment      I personally saw and evaluated the patient, and participated in the management and treatment plan.  Hazle Nordmann, PA-C Orthopedics

## 2023-06-25 ENCOUNTER — Telehealth: Payer: Self-pay | Admitting: Orthopaedic Surgery

## 2023-06-25 NOTE — Telephone Encounter (Signed)
 Received call from patient requesting his [employer] form be emailed to his HR. I emailed to [email or web address]

## 2023-07-01 ENCOUNTER — Ambulatory Visit (HOSPITAL_BASED_OUTPATIENT_CLINIC_OR_DEPARTMENT_OTHER): Admitting: Student

## 2023-07-01 ENCOUNTER — Encounter: Payer: Self-pay | Admitting: Orthopaedic Surgery

## 2023-07-01 ENCOUNTER — Ambulatory Visit: Admitting: Orthopaedic Surgery

## 2023-07-01 DIAGNOSIS — Z96641 Presence of right artificial hip joint: Secondary | ICD-10-CM

## 2023-07-01 MED ORDER — OXYCODONE HCL 5 MG PO TABS: 5.0000 mg | ORAL_TABLET | Freq: Four times a day (QID) | ORAL | 0 refills | Status: AC | PRN
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
## 2023-07-03 ENCOUNTER — Telehealth: Payer: Self-pay | Admitting: Orthopaedic Surgery

## 2023-07-03 NOTE — Telephone Encounter (Signed)
 Pt submitted medical release form, short term forms, and payment $20.00 cash. Accepted 07/03/23

## 2023-07-03 NOTE — Telephone Encounter (Signed)
Completed/faxed

## 2023-07-09 ENCOUNTER — Telehealth: Payer: Self-pay | Admitting: Orthopaedic Surgery

## 2023-07-09 NOTE — Telephone Encounter (Signed)
 Patient called and wants to know if you could prescribe him something to sleep. He said he isn't sleeping at all. [phone]

## 2023-07-09 NOTE — Telephone Encounter (Signed)
 Patient aware of the below message

## 2023-07-10 ENCOUNTER — Other Ambulatory Visit: Payer: Self-pay | Admitting: Orthopaedic Surgery

## 2023-07-23 ENCOUNTER — Encounter (HOSPITAL_BASED_OUTPATIENT_CLINIC_OR_DEPARTMENT_OTHER): Payer: Self-pay | Admitting: Student

## 2023-07-23 ENCOUNTER — Ambulatory Visit (HOSPITAL_BASED_OUTPATIENT_CLINIC_OR_DEPARTMENT_OTHER): Admitting: Student

## 2023-07-23 ENCOUNTER — Ambulatory Visit (HOSPITAL_BASED_OUTPATIENT_CLINIC_OR_DEPARTMENT_OTHER)

## 2023-07-23 DIAGNOSIS — S82891A Other fracture of right lower leg, initial encounter for closed fracture: Secondary | ICD-10-CM | POA: Diagnosis not present

## 2023-07-23 NOTE — Progress Notes (Signed)
 Post Operative Evaluation    Procedure/Date of Surgery: Right ankle ORIF 06/16/2023  Interval History:   Patient presents today 5 weeks status post right ankle ORIF.  Overall he reports that he is doing well and continuing to improve.  He has been weightbearing in the cam boot.  Takes oxycodone  occasionally as needed for pain.  Has been working with home health PT.   PMH/PSH/Family History/Social History/Meds/Allergies:   History reviewed. No pertinent past medical history. Past Surgical History:  Procedure Laterality Date   ORIF ANKLE FRACTURE Right 06/16/2023   Procedure: OPEN REDUCTION INTERNAL FIXATION (ORIF) ANKLE FRACTURE;  Surgeon: Wilhelmenia Harada, MD;  Location: [facility] OR;  Service: Orthopedics;  Laterality: Right;   STERNUM FRACTURE SURGERY     TOTAL HIP ARTHROPLASTY Right 06/16/2023   Procedure: ARTHROPLASTY, HIP, TOTAL, ANTERIOR APPROACH;  Surgeon: Arnie Lao, MD;  Location: [facility] OR;  Service: Orthopedics;  Laterality: Right;   Social History   Socioeconomic History   Marital status: Single    Spouse name: Not on file   Number of children: Not on file   Years of education: Not on file   Highest education level: Not on file  Occupational History   Occupation: Location manager  Tobacco Use   Smoking status: Never   Smokeless tobacco: Never  Vaping Use   Vaping status: Never Used  Substance and Sexual Activity   Alcohol use: Yes    Alcohol/week: 4.0 standard drinks of alcohol    Types: 4 Standard drinks or equivalent per week    Comment: BEER and WINE occ   Drug use: No   Sexual activity: Yes    Birth control/protection: None  Other Topics Concern   Not on file  Social History Narrative   Exercise running, weights, and cardio   Social Drivers of Health   Financial Resource Strain: Not on file  Food Insecurity: Patient Declined (06/15/2023)   Hunger Vital Sign    Worried About Running Out of Food in the Last Year:  Patient declined    Ran Out of Food in the Last Year: Patient declined  Transportation Needs: Patient Declined (06/15/2023)   PRAPARE - Administrator, Civil Service (Medical): Patient declined    Lack of Transportation (Non-Medical): Patient declined  Physical Activity: Not on file  Stress: Not on file  Social Connections: Not on file   Family History  Problem Relation Age of Onset   Hypertension Father    Anemia Sister    Diabetes Paternal Grandmother    Hyperlipidemia Paternal Grandmother    No Known Allergies Current Outpatient Medications  Medication Sig Dispense Refill   aspirin  EC 325 MG tablet Take 1 tablet (325 mg total) by mouth daily. 30 tablet 0   Multiple Vitamin (MULTIVITAMIN) tablet Take 1 tablet by mouth daily.     oxyCODONE  (OXY IR/ROXICODONE ) 5 MG immediate release tablet Take 1 tablet (5 mg total) by mouth every 6 (six) hours as needed for severe pain (pain score 7-10). 30 tablet 0   pantoprazole  (PROTONIX ) 20 MG tablet Take by mouth.     No current facility-administered medications for this visit.   No results found.  Review of Systems:   A ROS was performed including pertinent positives and negatives as documented in the HPI.   Musculoskeletal Exam:  There were no vitals taken for this visit.  Right ankle incisions are well-appearing without evidence of erythema or drainage.  Minimal swelling about the foot and ankle.  Active ROM to 10 degrees of both dorsiflexion and plantarflexion.  DP and PT pulses are 2+.  Distal neurosensory exam intact.  Imaging:   Xray (right ankle 3 views): ORIF hardware of the medial malleolus in good position with overall little evidence of callus formation.   I personally reviewed and interpreted the radiographs.   Assessment:   Patient is 5 weeks status post right ankle ORIF due to a medial malleolus fracture.  He is doing well today with weightbearing in the cam boot and pain is well-controlled.  At this  point I would like him to continue progressing forward with physical therapy particularly for range of motion of the ankle in order to become more functional and progress back into weightbearing out of the boot.  Recommend transition to ibuprofen /Tylenol  as needed for pain.  Will plan to have him follow-up with Dr. Hermina Loosen in another 4 weeks for reassessment.  Plan :    -Return to clinic in 4 weeks for reassessment      I personally saw and evaluated the patient, and participated in the management and treatment plan.  Sharrell Deck, PA-C Orthopedics

## 2023-07-29 ENCOUNTER — Encounter: Payer: Self-pay | Admitting: Orthopaedic Surgery

## 2023-07-29 ENCOUNTER — Other Ambulatory Visit (INDEPENDENT_AMBULATORY_CARE_PROVIDER_SITE_OTHER): Payer: Self-pay

## 2023-07-29 ENCOUNTER — Ambulatory Visit (INDEPENDENT_AMBULATORY_CARE_PROVIDER_SITE_OTHER): Admitting: Orthopaedic Surgery

## 2023-07-29 DIAGNOSIS — Z96641 Presence of right artificial hip joint: Secondary | ICD-10-CM | POA: Diagnosis not present
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
## 2023-08-01 ENCOUNTER — Encounter (HOSPITAL_BASED_OUTPATIENT_CLINIC_OR_DEPARTMENT_OTHER): Payer: Self-pay | Admitting: Student

## 2023-08-01 ENCOUNTER — Ambulatory Visit (HOSPITAL_BASED_OUTPATIENT_CLINIC_OR_DEPARTMENT_OTHER): Admitting: Student

## 2023-08-01 DIAGNOSIS — S82891A Other fracture of right lower leg, initial encounter for closed fracture: Secondary | ICD-10-CM

## 2023-08-01 NOTE — Progress Notes (Signed)
 Patient came in today for a left in suture removal. Suture was removed and Richard Walters looked at incision and said everything looked ok. No drainage.

## 2023-08-21 ENCOUNTER — Ambulatory Visit (HOSPITAL_BASED_OUTPATIENT_CLINIC_OR_DEPARTMENT_OTHER)

## 2023-08-21 ENCOUNTER — Ambulatory Visit (HOSPITAL_BASED_OUTPATIENT_CLINIC_OR_DEPARTMENT_OTHER): Admitting: Orthopaedic Surgery

## 2023-08-21 DIAGNOSIS — S82891A Other fracture of right lower leg, initial encounter for closed fracture: Secondary | ICD-10-CM

## 2023-08-21 NOTE — Progress Notes (Signed)
 Post Operative Evaluation    Procedure/Date of Surgery: Right ankle open reduction internal fixation 3/30  Interval History:    Presents 2 weeks status post the above procedure.  Overall he is doing extremely well.  He is walking in his boot.  He has been making progress with therapy.  He is hopeful to get back to work.   PMH/PSH/Family History/Social History/Meds/Allergies:   No past medical history on file. Past Surgical History:  Procedure Laterality Date   ORIF ANKLE FRACTURE Right 06/16/2023   Procedure: OPEN REDUCTION INTERNAL FIXATION (ORIF) ANKLE FRACTURE;  Surgeon: Wilhelmenia Harada, MD;  Location: [facility] OR;  Service: Orthopedics;  Laterality: Right;   STERNUM FRACTURE SURGERY     TOTAL HIP ARTHROPLASTY Right 06/16/2023   Procedure: ARTHROPLASTY, HIP, TOTAL, ANTERIOR APPROACH;  Surgeon: Arnie Lao, MD;  Location: [facility] OR;  Service: Orthopedics;  Laterality: Right;   Social History   Socioeconomic History   Marital status: Single    Spouse name: Not on file   Number of children: Not on file   Years of education: Not on file   Highest education level: Not on file  Occupational History   Occupation: Location manager  Tobacco Use   Smoking status: Never   Smokeless tobacco: Never  Vaping Use   Vaping status: Never Used  Substance and Sexual Activity   Alcohol use: Yes    Alcohol/week: 4.0 standard drinks of alcohol    Types: 4 Standard drinks or equivalent per week    Comment: BEER and WINE occ   Drug use: No   Sexual activity: Yes    Birth control/protection: None  Other Topics Concern   Not on file  Social History Narrative   Exercise running, weights, and cardio   Social Drivers of Health   Financial Resource Strain: Not on file  Food Insecurity: Patient Declined (06/15/2023)   Hunger Vital Sign    Worried About Running Out of Food in the Last Year: Patient declined    Ran Out of Food in the Last Year: Patient  declined  Transportation Needs: Patient Declined (06/15/2023)   PRAPARE - Administrator, Civil Service (Medical): Patient declined    Lack of Transportation (Non-Medical): Patient declined  Physical Activity: Not on file  Stress: Not on file  Social Connections: Not on file   Family History  Problem Relation Age of Onset   Hypertension Father    Anemia Sister    Diabetes Paternal Grandmother    Hyperlipidemia Paternal Grandmother    No Known Allergies Current Outpatient Medications  Medication Sig Dispense Refill   aspirin  EC 325 MG tablet Take 1 tablet (325 mg total) by mouth daily. 30 tablet 0   Multiple Vitamin (MULTIVITAMIN) tablet Take 1 tablet by mouth daily.     oxyCODONE  (OXY IR/ROXICODONE ) 5 MG immediate release tablet Take 1 tablet (5 mg total) by mouth every 6 (six) hours as needed for severe pain (pain score 7-10). 30 tablet 0   pantoprazole  (PROTONIX ) 20 MG tablet Take by mouth.     No current facility-administered medications for this visit.   No results found.  Review of Systems:   A ROS was performed including pertinent positives and negatives as documented in the HPI.   Musculoskeletal Exam:    There were no vitals taken  for this visit.  Right ankle incision is well-appearing without erythema or drainage.  Mild swelling about the ankle.  He is able to fully walk without pain  Imaging:    3 views right ankle: Status post medial malleolar open reduction internal fixation doing well  I personally reviewed and interpreted the radiographs.   Assessment:   53 year old male who is 2 months status post right medial malleolar open reduction internal fixation overall doing very well.  At this time he will discontinue his boot and advance his weightbearing as tolerated.  There is callus today on x-rays.  Given this we will plan to progress him as tolerated.  He may return to work at this time  Plan :    - Return to clinic 8 weeks for  reassessment      I personally saw and evaluated the patient, and participated in the management and treatment plan.  Wilhelmenia Harada, MD Attending Physician, Orthopedic Surgery  This document was dictated using Dragon voice recognition software. A reasonable attempt at proof reading has been made to minimize errors.

## 2023-10-23 ENCOUNTER — Ambulatory Visit (HOSPITAL_BASED_OUTPATIENT_CLINIC_OR_DEPARTMENT_OTHER): Admitting: Orthopaedic Surgery

## 2023-10-23 ENCOUNTER — Ambulatory Visit (INDEPENDENT_AMBULATORY_CARE_PROVIDER_SITE_OTHER)

## 2023-10-23 DIAGNOSIS — S82891A Other fracture of right lower leg, initial encounter for closed fracture: Secondary | ICD-10-CM

## 2023-10-23 NOTE — Progress Notes (Signed)
 Post Operative Evaluation    Procedure/Date of Surgery: Right ankle open reduction internal fixation 3/30  Interval History:    Presents 4 months status post the above procedure.  Overall he is doing extremely well.  He is now walking without any type of limp just some occasional swelling at the end of the day  PMH/PSH/Family History/Social History/Meds/Allergies:   No past medical history on file. Past Surgical History:  Procedure Laterality Date   ORIF ANKLE FRACTURE Right 06/16/2023   Procedure: OPEN REDUCTION INTERNAL FIXATION (ORIF) ANKLE FRACTURE;  Surgeon: Genelle Standing, MD;  Location: [facility];  Service: Orthopedics;  Laterality: Right;   STERNUM FRACTURE SURGERY     TOTAL HIP ARTHROPLASTY Right 06/16/2023   Procedure: ARTHROPLASTY, HIP, TOTAL, ANTERIOR APPROACH;  Surgeon: Vernetta Lonni GRADE, MD;  Location: [facility];  Service: Orthopedics;  Laterality: Right;   Social History   Socioeconomic History   Marital status: Single    Spouse name: Not on file   Number of children: Not on file   Years of education: Not on file   Highest education level: Not on file  Occupational History   Occupation: Location manager  Tobacco Use   Smoking status: Never   Smokeless tobacco: Never  Vaping Use   Vaping status: Never Used  Substance and Sexual Activity   Alcohol use: Yes    Alcohol/week: 4.0 standard drinks of alcohol    Types: 4 Standard drinks or equivalent per week    Comment: BEER and WINE occ   Drug use: No   Sexual activity: Yes    Birth control/protection: None  Other Topics Concern   Not on file  Social History Narrative   Exercise running, weights, and cardio   Social Drivers of Health   Financial Resource Strain: Not on file  Food Insecurity: Patient Declined (06/15/2023)   Hunger Vital Sign    Worried About Running Out of Food in the Last Year: Patient declined    Ran Out of Food in the Last Year: Patient declined   Transportation Needs: Patient Declined (06/15/2023)   PRAPARE - Administrator, Civil Service (Medical): Patient declined    Lack of Transportation (Non-Medical): Patient declined  Physical Activity: Not on file  Stress: Not on file  Social Connections: Not on file   Family History  Problem Relation Age of Onset   Hypertension Father    Anemia Sister    Diabetes Paternal Grandmother    Hyperlipidemia Paternal Grandmother    No Known Allergies Current Outpatient Medications  Medication Sig Dispense Refill   aspirin  EC 325 MG tablet Take 1 tablet (325 mg total) by mouth daily. 30 tablet 0   Multiple Vitamin (MULTIVITAMIN) tablet Take 1 tablet by mouth daily.     oxyCODONE  (OXY IR/ROXICODONE ) 5 MG immediate release tablet Take 1 tablet (5 mg total) by mouth every 6 (six) hours as needed for severe pain (pain score 7-10). 30 tablet 0   pantoprazole  (PROTONIX ) 20 MG tablet Take by mouth.     No current facility-administered medications for this visit.   No results found.  Review of Systems:   A ROS was performed including pertinent positives and negatives as documented in the HPI.   Musculoskeletal Exam:    There were no vitals taken for this visit.  Right  ankle incision is well-appearing without erythema or drainage.  Mild swelling about the ankle.  He is able to fully walk without pain  Imaging:    3 views right ankle: Status post medial malleolar open reduction internal fixation doing well  I personally reviewed and interpreted the radiographs.   Assessment:   53 year old male who is 22-month status post above procedure.  Overall doing extremely well.  Xrays today show healed fracture  Plan :    - Return to clinic as needed      I personally saw and evaluated the patient, and participated in the management and treatment plan.  Elspeth Parker, MD Attending Physician, Orthopedic Surgery  This document was dictated using Dragon voice recognition  software. A reasonable attempt at proof reading has been made to minimize errors.

## 2023-10-28 ENCOUNTER — Encounter: Payer: Self-pay | Admitting: Orthopaedic Surgery

## 2023-10-28 ENCOUNTER — Ambulatory Visit: Admitting: Orthopaedic Surgery

## 2023-10-28 DIAGNOSIS — Z96641 Presence of right artificial hip joint: Secondary | ICD-10-CM | POA: Diagnosis not present
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
## 2024-01-20 ENCOUNTER — Encounter: Payer: Self-pay | Admitting: Radiology
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
## 2024-04-29 ENCOUNTER — Ambulatory Visit: Admitting: Orthopaedic Surgery
